# Patient Record
Sex: Male | Born: 1940
Health system: Southern US, Community
[De-identification: ages and names within clinical notes are randomized; demographics above are authoritative.]

## PROBLEM LIST (undated history)

## (undated) DIAGNOSIS — K8020 Calculus of gallbladder without cholecystitis without obstruction: Secondary | ICD-10-CM

## (undated) DIAGNOSIS — C4491 Basal cell carcinoma of skin, unspecified: Secondary | ICD-10-CM

## (undated) DIAGNOSIS — K469 Unspecified abdominal hernia without obstruction or gangrene: Secondary | ICD-10-CM

## (undated) DIAGNOSIS — K5790 Diverticulosis of intestine, part unspecified, without perforation or abscess without bleeding: Secondary | ICD-10-CM

## (undated) DIAGNOSIS — C8590 Non-Hodgkin lymphoma, unspecified, unspecified site: Secondary | ICD-10-CM

## (undated) DIAGNOSIS — N400 Enlarged prostate without lower urinary tract symptoms: Secondary | ICD-10-CM

## (undated) DIAGNOSIS — J309 Allergic rhinitis, unspecified: Secondary | ICD-10-CM

## (undated) DIAGNOSIS — Z8572 Personal history of non-Hodgkin lymphomas: Secondary | ICD-10-CM

## (undated) DIAGNOSIS — Z23 Encounter for immunization: Principal | ICD-10-CM

## (undated) DIAGNOSIS — H409 Unspecified glaucoma: Secondary | ICD-10-CM

## (undated) HISTORY — DX: Unspecified glaucoma: H40.9

## (undated) HISTORY — DX: Diverticulosis of intestine, part unspecified, without perforation or abscess without bleeding: K57.90

## (undated) HISTORY — DX: Encounter for immunization: Z23

## (undated) HISTORY — DX: Non-Hodgkin lymphoma, unspecified, unspecified site: C85.90

## (undated) HISTORY — DX: Personal history of non-Hodgkin lymphomas: Z85.72

## (undated) HISTORY — DX: Unspecified abdominal hernia without obstruction or gangrene: K46.9

## (undated) HISTORY — DX: Benign prostatic hyperplasia without lower urinary tract symptoms: N40.0

## (undated) HISTORY — DX: Calculus of gallbladder without cholecystitis without obstruction: K80.20

## (undated) HISTORY — PX: TONSILLECTOMY: SUR1361

## (undated) HISTORY — PX: APPENDECTOMY: SHX54

---

## 2001-11-14 HISTORY — PX: PROSTATE BIOPSY: SHX241

## 2004-11-01 ENCOUNTER — Ambulatory Visit (HOSPITAL_COMMUNITY): Admission: RE | Admit: 2004-11-01 | Discharge: 2004-11-01 | Payer: Self-pay | Admitting: Gastroenterology

## 2009-11-14 HISTORY — PX: COLONOSCOPY: SHX174

## 2011-12-07 DIAGNOSIS — H409 Unspecified glaucoma: Secondary | ICD-10-CM | POA: Diagnosis not present

## 2011-12-07 DIAGNOSIS — H401290 Low-tension glaucoma, unspecified eye, stage unspecified: Secondary | ICD-10-CM | POA: Diagnosis not present

## 2012-02-16 DIAGNOSIS — J069 Acute upper respiratory infection, unspecified: Secondary | ICD-10-CM | POA: Diagnosis not present

## 2012-03-13 DIAGNOSIS — Z125 Encounter for screening for malignant neoplasm of prostate: Secondary | ICD-10-CM | POA: Diagnosis not present

## 2012-03-13 DIAGNOSIS — R634 Abnormal weight loss: Secondary | ICD-10-CM | POA: Diagnosis not present

## 2012-03-14 ENCOUNTER — Other Ambulatory Visit: Payer: Self-pay | Admitting: Family Medicine

## 2012-03-14 DIAGNOSIS — R634 Abnormal weight loss: Secondary | ICD-10-CM

## 2012-03-15 ENCOUNTER — Ambulatory Visit
Admission: RE | Admit: 2012-03-15 | Discharge: 2012-03-15 | Disposition: A | Discharge status: Home / Self Care | Payer: Medicare Other | Source: Ambulatory Visit | Attending: Family Medicine | Admitting: Family Medicine

## 2012-03-15 DIAGNOSIS — R634 Abnormal weight loss: Secondary | ICD-10-CM

## 2012-04-16 DIAGNOSIS — R634 Abnormal weight loss: Secondary | ICD-10-CM | POA: Diagnosis not present

## 2012-05-08 DIAGNOSIS — L578 Other skin changes due to chronic exposure to nonionizing radiation: Secondary | ICD-10-CM | POA: Diagnosis not present

## 2012-05-08 DIAGNOSIS — D239 Other benign neoplasm of skin, unspecified: Secondary | ICD-10-CM | POA: Diagnosis not present

## 2012-05-08 DIAGNOSIS — R634 Abnormal weight loss: Secondary | ICD-10-CM | POA: Diagnosis not present

## 2012-05-08 DIAGNOSIS — L821 Other seborrheic keratosis: Secondary | ICD-10-CM | POA: Diagnosis not present

## 2012-05-08 DIAGNOSIS — L819 Disorder of pigmentation, unspecified: Secondary | ICD-10-CM | POA: Diagnosis not present

## 2012-06-28 DIAGNOSIS — H4011X0 Primary open-angle glaucoma, stage unspecified: Secondary | ICD-10-CM | POA: Diagnosis not present

## 2012-06-28 DIAGNOSIS — H409 Unspecified glaucoma: Secondary | ICD-10-CM | POA: Diagnosis not present

## 2012-08-14 DIAGNOSIS — K469 Unspecified abdominal hernia without obstruction or gangrene: Secondary | ICD-10-CM

## 2012-08-14 HISTORY — DX: Unspecified abdominal hernia without obstruction or gangrene: K46.9

## 2012-08-30 ENCOUNTER — Other Ambulatory Visit: Payer: Self-pay | Admitting: Family Medicine

## 2012-08-30 DIAGNOSIS — R1904 Left lower quadrant abdominal swelling, mass and lump: Secondary | ICD-10-CM | POA: Diagnosis not present

## 2012-09-04 ENCOUNTER — Ambulatory Visit
Admission: RE | Admit: 2012-09-04 | Discharge: 2012-09-04 | Disposition: A | Discharge status: Home / Self Care | Payer: Medicare Other | Source: Ambulatory Visit | Attending: Family Medicine | Admitting: Family Medicine

## 2012-09-04 DIAGNOSIS — R1904 Left lower quadrant abdominal swelling, mass and lump: Secondary | ICD-10-CM

## 2012-09-04 DIAGNOSIS — K8020 Calculus of gallbladder without cholecystitis without obstruction: Secondary | ICD-10-CM | POA: Diagnosis not present

## 2012-09-13 ENCOUNTER — Encounter (INDEPENDENT_AMBULATORY_CARE_PROVIDER_SITE_OTHER): Payer: Self-pay | Admitting: General Surgery

## 2012-09-13 ENCOUNTER — Ambulatory Visit (INDEPENDENT_AMBULATORY_CARE_PROVIDER_SITE_OTHER): Payer: Medicare Other | Admitting: General Surgery

## 2012-09-13 VITALS — BP 130/68 | HR 68 | Temp 97.4°F | Resp 20 | Ht 74.0 in | Wt 183.4 lb

## 2012-09-13 DIAGNOSIS — K4090 Unilateral inguinal hernia, without obstruction or gangrene, not specified as recurrent: Secondary | ICD-10-CM | POA: Diagnosis not present

## 2012-09-13 NOTE — Progress Notes (Signed)
Patient ID: Daniel Vasquez, male   DOB: 06/24/1941, 71 y.o.   MRN: [id number]  Chief Complaint  Patient presents with  . Pre-op Exam    eval RIH    HPI Daniel Vasquez is a 71 y.o. male.   HPI 71 year old Caucasian male referred by Daniel Vasquez for evaluation of a right inguinal hernia. The patient states that he actually initially noticed something in the left groin. He states that he noticed some puffiness in his left groin a few weeks ago. He denies any pain or discomfort in his groin. He states that he noticed that the left side was just out a little further than the right side. He denies any numbness or tingling in his groin. He denies any fever, chills, nausea, vomiting, diarrhea or constipation. He went to see his primary care physician who ordered a CT scan. There is no evidence of a left inguinal hernia; however, the CT scan showed a right inguinal hernia.  He states that he is very sensitive about hernias because his father passed away from complications after an emergent inguinal hernia surgery. He states that he did lose about 15 pounds a few months ago for unknown reasons however he has gained about 10 of those pounds back.  History reviewed. No pertinent past medical history.  Past Surgical History  Procedure Date  . Appendectomy   . Tonsillectomy     Family History  Problem Relation Age of Onset  . Cancer Father     prostate  . Heart disease Maternal Grandmother     Social History History  Substance Use Topics  . Smoking status: Former Games developer  . Smokeless tobacco: Not on file  . Alcohol Use: Yes     5 drinks per week    Allergies  Allergen Reactions  . Iodinated Diagnostic Agents     Rash and hives with contrast > 19yrs ago    Current Outpatient Prescriptions  Medication Sig Dispense Refill  . aspirin 81 MG tablet Take 81 mg by mouth daily.      [name] OVER THE COUNTER MEDICATION Multi vitamin      . travoprost, benzalkonium, (TRAVATAN) 0.004 % ophthalmic  solution 1 drop at bedtime.        Review of Systems Review of Systems  Constitutional: Negative for fever, chills, appetite change and unexpected weight change.  HENT: Negative for congestion and trouble swallowing.   Eyes: Negative for visual disturbance.  Respiratory: Negative for chest tightness and shortness of breath.   Cardiovascular: Negative for chest pain and leg swelling.       No PND, no orthopnea, no DOE  Gastrointestinal:       See HPI  Genitourinary: Negative for dysuria and hematuria.  Musculoskeletal: Negative.   Skin: Negative for rash.  Neurological: Negative for dizziness, seizures, speech difficulty, light-headedness and headaches.       No amaurosis fugax, no TIA  Hematological: Does not bruise/bleed easily.  Psychiatric/Behavioral: Negative for behavioral problems and confusion.    Blood pressure 130/68, pulse 68, temperature 97.4 F (36.3 C), temperature source Temporal, resp. rate 20, height 6\' 2"  (1.88 m), weight 183 lb 6.4 oz (83.19 kg).  Physical Exam Physical Exam  Vitals reviewed. Constitutional: He is oriented to person, place, and time. He appears well-developed and well-nourished. No distress.  HENT:  Head: Normocephalic and atraumatic.  Right Ear: External ear normal.  Left Ear: External ear normal.  Eyes: Conjunctivae normal are normal. No scleral icterus.  Neck: Normal range  of motion. Neck supple. No tracheal deviation present. No thyromegaly present.  Cardiovascular: Normal rate, regular rhythm and normal heart sounds.   No murmur heard. Pulmonary/Chest: Effort normal and breath sounds normal. No respiratory distress. He has no wheezes. He has no rales.  Abdominal: Soft. Bowel sounds are normal. He exhibits no distension. There is no tenderness. There is no rebound. A hernia is present. Hernia confirmed positive in the right inguinal area. Hernia confirmed negative in the left inguinal area.  Genitourinary: Testes normal and penis normal.  Right testis shows no mass. Left testis shows no mass.       Pt examined supine and standing: Reducible right direct inguinal hernia; no obvious hernia on left; very vague fullness in left groin  Musculoskeletal: Normal range of motion. He exhibits no edema and no tenderness.  Lymphadenopathy:    He has no cervical adenopathy.  Neurological: He is alert and oriented to person, place, and time. He exhibits normal muscle tone.  Skin: Skin is warm and dry. No rash noted. He is not diaphoretic. No erythema. No pallor.  Psychiatric: He has a normal mood and affect. His behavior is normal. Judgment and thought content normal.    Data Reviewed Dr Nnodi's note   CT ABDOMEN AND PELVIS WITHOUT CONTRAST  Technique: Multidetector CT imaging of the abdomen and pelvis was  performed following the standard protocol without intravenous  contrast.  Comparison: Upper GI of 03/15/2012.  Findings: Inferior right middle lobe lung nodule which measures 4  mm on image 7.  Left lower lobe lung nodule which measures 4 mm on image 4.  Heart size upper normal, without pericardial or pleural effusion.  The lower thoracic esophagus is mildly dilated, and fluid-filled on  image 4.  Normal uninfused appearance of the liver. A small splenule.  Normal pancreas. A 1.7 cm stone in the gallbladder neck, without  acute cholecystitis or biliary ductal dilatation.  Normal adrenal glands and kidneys, without hydronephrosis. No  retroperitoneal or retrocrural adenopathy.  Normal colon and terminal ileum. Appendix surgically absent.  Normal small bowel without abdominal ascites.  There is no evidence of left inguinal hernia or groin mass. No  pelvic adenopathy. Urinary bladder is positioned at the entrance  of the right inguinal canal on image 77. Suspect minimal fat  containing hernia on image 79. Normal prostate, without  significant free pelvic fluid. Mild degenerative changes in the  bilateral hips and sacroiliac  joints. No acute osseous abnormality.  IMPRESSION:  1. No evidence of left-sided mass or hernia.  2. Cholelithiasis.  3. Fluid in the lower thoracic esophagus, suggesting dysmotility  or gastroesophageal reflux.  4. Right-sided urinary bladder entering a small right inguinal  hernia.  4. Lung base nodules up to 4 mm. If the patient is at high risk  for bronchogenic carcinoma, follow-up chest CT at 1 year is  recommended. If the patient is at low risk, no follow-up is  needed.    Assessment    Right inguinal hernia Questionable LI[facility]    Plan    We discussed the etiology of inguinal hernias. We discussed the signs & symptoms of incarceration & strangulation.  We discussed non-operative and operative management. I discussed laparoscopic repair. Because of his sensation of fullness on the left side, I recommended a laparoscopic approach. That way we can evaluate the left side and if he does have a hernia on left it can be repaired at the same time as the right inguinal hernia  The patient  has elected to proceed with a laparoscopic repair of a right inguinal hernia with mesh possible left hernia as well    I described the procedure in detail.  The patient was given educational material. We discussed the risks and benefits including but not limited to bleeding, infection, chronic inguinal pain, nerve entrapment, hernia recurrence, mesh complications, hematoma formation, urinary retention, injury to the testicles, numbness in the groin, blood clots, injury to the surrounding structures, and anesthesia risk. We also discussed the typical post operative recovery course, including no heavy lifting for 4-6 weeks. I explained that the likelihood of improvement of their symptoms is good    He would like to think about it for a day or 2. I told him to contact the office if he has additional questions or when he would like to schedule surgery  I will defer the followup of lung nodules to his primary  care physician  Mary Sella. Andrey Campanile, MD, FACS General, Bariatric, & Minimally Invasive Surgery [facility], [place]         [facility] M 09/13/2012, 5:05 PM

## 2012-09-13 NOTE — Patient Instructions (Signed)
Please call our office when you would like to schedule surgery or if you have additional questions

## 2012-09-17 DIAGNOSIS — R109 Unspecified abdominal pain: Secondary | ICD-10-CM | POA: Diagnosis not present

## 2012-09-17 DIAGNOSIS — R319 Hematuria, unspecified: Secondary | ICD-10-CM | POA: Diagnosis not present

## 2012-09-18 ENCOUNTER — Other Ambulatory Visit: Payer: Self-pay | Admitting: Family Medicine

## 2012-09-18 DIAGNOSIS — R319 Hematuria, unspecified: Secondary | ICD-10-CM

## 2012-09-19 ENCOUNTER — Ambulatory Visit
Admission: RE | Admit: 2012-09-19 | Discharge: 2012-09-19 | Disposition: A | Discharge status: Home / Self Care | Payer: Medicare Other | Source: Ambulatory Visit | Attending: Family Medicine | Admitting: Family Medicine

## 2012-09-19 DIAGNOSIS — R319 Hematuria, unspecified: Secondary | ICD-10-CM | POA: Diagnosis not present

## 2012-09-19 DIAGNOSIS — R109 Unspecified abdominal pain: Secondary | ICD-10-CM | POA: Diagnosis not present

## 2012-10-01 DIAGNOSIS — Z23 Encounter for immunization: Secondary | ICD-10-CM | POA: Diagnosis not present

## 2012-10-15 DIAGNOSIS — R319 Hematuria, unspecified: Secondary | ICD-10-CM | POA: Diagnosis not present

## 2012-11-22 DIAGNOSIS — R599 Enlarged lymph nodes, unspecified: Secondary | ICD-10-CM | POA: Diagnosis not present

## 2012-12-04 DIAGNOSIS — R599 Enlarged lymph nodes, unspecified: Secondary | ICD-10-CM | POA: Diagnosis not present

## 2012-12-13 DIAGNOSIS — R221 Localized swelling, mass and lump, neck: Secondary | ICD-10-CM | POA: Diagnosis not present

## 2012-12-13 DIAGNOSIS — R220 Localized swelling, mass and lump, head: Secondary | ICD-10-CM | POA: Diagnosis not present

## 2012-12-14 ENCOUNTER — Other Ambulatory Visit: Payer: Self-pay | Admitting: Otolaryngology

## 2012-12-14 DIAGNOSIS — R220 Localized swelling, mass and lump, head: Secondary | ICD-10-CM

## 2012-12-20 ENCOUNTER — Ambulatory Visit
Admission: RE | Admit: 2012-12-20 | Discharge: 2012-12-20 | Disposition: A | Discharge status: Home / Self Care | Payer: Medicare Other | Source: Ambulatory Visit | Attending: Otolaryngology | Admitting: Otolaryngology

## 2012-12-20 DIAGNOSIS — R220 Localized swelling, mass and lump, head: Secondary | ICD-10-CM | POA: Diagnosis not present

## 2012-12-20 DIAGNOSIS — R221 Localized swelling, mass and lump, neck: Secondary | ICD-10-CM | POA: Diagnosis not present

## 2012-12-20 MED ORDER — GADOBENATE DIMEGLUMINE 529 MG/ML IV SOLN
17.0000 mL | Freq: Once | INTRAVENOUS | Status: AC | PRN
  Administered 2012-12-20: 17 mL via INTRAVENOUS

## 2012-12-26 ENCOUNTER — Other Ambulatory Visit: Payer: Self-pay | Admitting: Otolaryngology

## 2012-12-26 DIAGNOSIS — R599 Enlarged lymph nodes, unspecified: Secondary | ICD-10-CM | POA: Diagnosis not present

## 2012-12-26 DIAGNOSIS — D234 Other benign neoplasm of skin of scalp and neck: Secondary | ICD-10-CM | POA: Diagnosis not present

## 2012-12-26 DIAGNOSIS — C801 Malignant (primary) neoplasm, unspecified: Secondary | ICD-10-CM | POA: Insufficient documentation

## 2012-12-26 DIAGNOSIS — D487 Neoplasm of uncertain behavior of other specified sites: Secondary | ICD-10-CM | POA: Diagnosis not present

## 2012-12-26 DIAGNOSIS — R221 Localized swelling, mass and lump, neck: Secondary | ICD-10-CM | POA: Diagnosis not present

## 2012-12-26 DIAGNOSIS — R220 Localized swelling, mass and lump, head: Secondary | ICD-10-CM | POA: Diagnosis not present

## 2012-12-26 DIAGNOSIS — L723 Sebaceous cyst: Secondary | ICD-10-CM | POA: Diagnosis not present

## 2012-12-26 HISTORY — PX: SOFT TISSUE MASS EXCISION: SHX2419

## 2012-12-28 DIAGNOSIS — C8590 Non-Hodgkin lymphoma, unspecified, unspecified site: Secondary | ICD-10-CM

## 2012-12-28 HISTORY — DX: Non-Hodgkin lymphoma, unspecified, unspecified site: C85.90

## 2012-12-29 ENCOUNTER — Other Ambulatory Visit: Payer: Self-pay

## 2013-01-01 ENCOUNTER — Telehealth: Payer: Self-pay | Admitting: Oncology

## 2013-01-01 NOTE — Telephone Encounter (Signed)
LVOM for pt return call.  °

## 2013-01-02 ENCOUNTER — Telehealth: Payer: Self-pay | Admitting: Oncology

## 2013-01-02 NOTE — Telephone Encounter (Signed)
C/D 01/02/13 for appt. 01/08/13

## 2013-01-02 NOTE — Telephone Encounter (Signed)
S/W pt in re NP appt 02/25 @ 1:30 w/Dr. Gaylyn Rong.  Referring Dr. Melvenia Beam Dx- Non-Hodgkins B-Cell Lymphoma Welcome packet mailed.

## 2013-01-03 ENCOUNTER — Encounter: Payer: Self-pay | Admitting: *Deleted

## 2013-01-03 ENCOUNTER — Other Ambulatory Visit: Payer: Self-pay | Admitting: Oncology

## 2013-01-03 DIAGNOSIS — Z8579 Personal history of other malignant neoplasms of lymphoid, hematopoietic and related tissues: Secondary | ICD-10-CM | POA: Insufficient documentation

## 2013-01-03 DIAGNOSIS — K8020 Calculus of gallbladder without cholecystitis without obstruction: Secondary | ICD-10-CM | POA: Insufficient documentation

## 2013-01-03 DIAGNOSIS — K469 Unspecified abdominal hernia without obstruction or gangrene: Secondary | ICD-10-CM | POA: Insufficient documentation

## 2013-01-03 DIAGNOSIS — C8590 Non-Hodgkin lymphoma, unspecified, unspecified site: Secondary | ICD-10-CM

## 2013-01-04 ENCOUNTER — Other Ambulatory Visit: Payer: Self-pay | Admitting: Lab

## 2013-01-04 ENCOUNTER — Ambulatory Visit
Admission: RE | Admit: 2013-01-04 | Discharge: 2013-01-04 | Disposition: A | Discharge status: Home / Self Care | Payer: Medicare Other | Source: Ambulatory Visit | Attending: Radiation Oncology | Admitting: Radiation Oncology

## 2013-01-04 ENCOUNTER — Encounter: Payer: Self-pay | Admitting: Radiation Oncology

## 2013-01-04 VITALS — BP 139/84 | HR 66 | Temp 98.1°F | Resp 20 | Ht 72.0 in | Wt 185.4 lb

## 2013-01-04 DIAGNOSIS — Z79899 Other long term (current) drug therapy: Secondary | ICD-10-CM | POA: Diagnosis not present

## 2013-01-04 DIAGNOSIS — C8589 Other specified types of non-Hodgkin lymphoma, extranodal and solid organ sites: Secondary | ICD-10-CM | POA: Diagnosis not present

## 2013-01-04 DIAGNOSIS — Z7982 Long term (current) use of aspirin: Secondary | ICD-10-CM | POA: Diagnosis not present

## 2013-01-04 DIAGNOSIS — C4491 Basal cell carcinoma of skin, unspecified: Secondary | ICD-10-CM | POA: Insufficient documentation

## 2013-01-04 DIAGNOSIS — N400 Enlarged prostate without lower urinary tract symptoms: Secondary | ICD-10-CM | POA: Insufficient documentation

## 2013-01-04 DIAGNOSIS — C8590 Non-Hodgkin lymphoma, unspecified, unspecified site: Secondary | ICD-10-CM

## 2013-01-04 HISTORY — DX: Basal cell carcinoma of skin, unspecified: C44.91

## 2013-01-04 HISTORY — DX: Allergic rhinitis, unspecified: J30.9

## 2013-01-04 LAB — COMPREHENSIVE METABOLIC PANEL (CC13)
ALT: 30 U/L (ref 0–55)
AST: 25 U/L (ref 5–34)
Alkaline Phosphatase: 95 U/L (ref 40–150)
Sodium: 138 mEq/L (ref 136–145)
Total Bilirubin: 0.57 mg/dL (ref 0.20–1.20)
Total Protein: 7.2 g/dL (ref 6.4–8.3)

## 2013-01-04 LAB — CBC WITH DIFFERENTIAL/PLATELET
EOS%: 0.8 % (ref 0.0–7.0)
MCH: 30.8 pg (ref 27.2–33.4)
MCV: 89.6 fL (ref 79.3–98.0)
MONO%: 11.7 % (ref 0.0–14.0)
NEUT#: 3.2 10*3/uL (ref 1.5–6.5)
RBC: 4.97 10*6/uL (ref 4.20–5.82)
RDW: 13.5 % (ref 11.0–14.6)

## 2013-01-04 LAB — LACTATE DEHYDROGENASE (CC13): LDH: 150 U/L (ref 125–245)

## 2013-01-04 NOTE — Progress Notes (Signed)
Pt married, wife w/pt today. 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 Pt scored 9/10 on distress scale; states it is due to new diagnosis 2 days ago, unsure if  this is his diagnosis,  lack of information. Pt and wife state they have many questions. Pt does not want to see SW today, but understands this RN will call SW to inform them of his stress level today.

## 2013-01-04 NOTE — Progress Notes (Signed)
Radiation Oncology         [phone]) [phone] ________________________________  Initial outpatient Consultation - Date: 01/04/2013   Name: Daniel Vasquez MRN: [id number]   DOB: [birth year]  REFERRING PHYSICIAN: Melvenia Beam, MD  DIAGNOSIS: The encounter diagnosis was Non-Hodgkin lymphoma.  HISTORY OF PRESENT ILLNESS::Daniel Vasquez is a 72 y.o. male  who had a six-month history of weight loss. This was noted by family members as well as his primary care physician. He estimates this to be about 10-20 pounds. He was told to increase his caloric intake and gained about 3 pounds back. He had not noticed any fevers chills or night sweats. The only interesting symptom is his back was itching more than normal. He does have a history of allergies to many products however so he attributed this to his allergies. He presented in October with complaints of abdominal swelling. A CT scan at that time was performed which showed a small right inguinal hernia. Reflux disease was also possibly noted. 4 mm lung nodules were noted. In January he noticed a pea-sized lesion behind his right ear. He attributed this to a bug bite. When it did not improve he sought attention. He was referred to ENT. This showed on the original physical exam ill-defined area of tenderness some very mild swelling. An MRI of this area was ordered and completed. This showed a 6 x 8 intense lesion within the subcutaneous fat with mild enhancement compatible with a small lymph node. A left mastoid effusion was noted. No other lymphadenopathy was noted. No mucosal lesions were noted. Secondary to this finding I observation versus FNA versus excisional biopsy were discussed. He chose to undergo excisional biopsy which was performed on 12/26/2012. This showed a lymph node with atypical follicular proliferation suspicious for non-Hodgkin's B-cell lymphoma. In discussing this with pathology it basically is a single lymph node with about 5 follicles of  high-grade appearing cells which are BCL-2 positive. This is highly concerning for a large cell follicular lymphoma. More tissue from another site may provide more information. He continues to not have any B. symptoms other than the weight loss which has stabilized. He has not had a PET scan. He is scheduled to meet Dr. Gaylyn Rong next week.  PREVIOUS RADIATION THERAPY: No  PAST MEDICAL HISTORY:  has a past medical history of Hernia (08/2012); Gallstones; BPH (benign prostatic hyperplasia); Glaucoma; Non-Hodgkin lymphoma (12/28/12); Allergic rhinitis; and Skin cancer, basal cell.    PAST SURGICAL HISTORY: Past Surgical History  Procedure Laterality Date  . Appendectomy    . Tonsillectomy    . Soft tissue mass excision  12/26/12    right neck lymph nodal tissue   . Prostate biopsy  2003    negative    FAMILY HISTORY: @[facility] @  SOCIAL HISTORY:  History  Substance Use Topics  . Smoking status: Former Games developer  . Smokeless tobacco: Not on file     Comment: in 20's, social smoker  . Alcohol Use: Yes     Comment: 5 drinks per week    ALLERGIES: Iodinated diagnostic agents  MEDICATIONS:  Current Outpatient Prescriptions  Medication Sig Dispense Refill  . aspirin 81 MG tablet Take 81 mg by mouth daily.      [name] OVER THE COUNTER MEDICATION Multi vitamin      . psyllium (METAMUCIL) 58.6 % packet Take 1 packet by mouth daily.      . travoprost, benzalkonium, (TRAVATAN) 0.004 % ophthalmic solution 1 drop at bedtime.  No current facility-administered medications for this encounter.    REVIEW OF SYSTEMS:  A 15 point review of systems is documented in the electronic medical record. This was obtained by the nursing staff. However, I reviewed this with the patient to discuss relevant findings and make appropriate changes.  Pertinent items are noted in HPI.   PHYSICAL EXAM:  Filed Vitals:   01/04/13 0911  BP: 139/84  Pulse: 66  Temp: 98.1 F (36.7 C)  Resp: 20  . He is a pleasant male in no  distress sitting comfortably examining chair. He appears younger than his stated age. He is alert and oriented x3. He has no palpable submandibular cervical or supraclavicular adenopathy. He has no palpable axillary adenopathy bilaterally. He has no palpable epitrochlear or popliteal adenopathy. I'm not able to palpate his right hernia. There is some fullness in his left inguinal region. I'm not able to palpate discrete nodes however. He has no hepatosplenomegaly.  LABORATORY DATA:  No results found for this basename: WBC, HGB, HCT, MCV, PLT   No results found for this basename: NA, K, CL, CO2   No results found for this basename: ALT, AST, GGT, ALKPHOS, BILITOT     RADIOGRAPHY: Mr Neck Soft Tissue Only W Wo Contrast  12/20/2012  *RADIOLOGY REPORT*  Clinical Data: Right postauricular mass.  Swelling, mass, or lump in head and neck.  BUN and creatinine were obtained on site at [facility] at [address] Results:  BUN 12 mg/dL,  Creatinine 0.9 mg/dL.  MR NECK SOFT TISSUE ONLY WITHOUT AND WITH CONTRAST  Contrast: 17mL MULTIHANCE GADOBENATE DIMEGLUMINE 529 MG/ML IV SOLN  Comparison: None.  Findings: A 6 x 8 mm T2 hyperintense lesion is present within the subcutaneous fat subjacent to the area marked.  This demonstrates mild enhancement is most compatible with a small lymph node.  No additional mass lesions are present.  A left mastoid effusion is present.  There is enhancement within the left mastoid air cells.  The visualized paranasal sinuses are otherwise clear.  No obstructing nasopharyngeal lesion is evident.  No significant anterior cervical lymph nodes are present.  The salivary glands are within normal limits.  No significant mucosal or submucosal lesion is present.  IMPRESSION:  1.  8 mm right subcutaneous lesion is most compatible with a small benign appearing lymph node. 2.  Left mastoid effusion demonstrates mild enhancement.  This may be infected.   Original Report Authenticated  By: Marin Roberts, M.D.       IMPRESSION: 72 year old male status post excisional biopsy of a posterior auricular lymph node with findings suspicious for non-Hodgkin's lymphoma  PLAN: I spoke with the patient and his wife regarding his diagnosis. I gave him a copy of his pathology report and we discussed the uncertainty. A PET scan would certainly help if it is positive and shows other areas of disease. This will be helpful not only to a more tissue but also to provide a more clear diagnosis. If his PET scan is negative pathology is concerned that this is a more high-grade lesion and therefore he would be at risk for elsewhere failures within the body. For this reason chemotherapy may be considered. If there is no other disease however and we have found this in the very early stage it also possible we can just proceed on with localized radiation. I think it's really hard to know how more formation. I've asked him to have his labs drawn today and is scheduled him for  a PET scan on Tuesday right before his appointment Dr. Gaylyn Rong. I gave him a copy of his CT scan report. I have not scheduled followup with me. I will followup with Dr. Gaylyn Rong on Tuesday.  I spent 60 minutes  face to face with the patient and more than 50% of that time was spent in counseling and/or coordination of care.   ------------------------------------------------  Lurline Hare, MD

## 2013-01-04 NOTE — Progress Notes (Signed)
Left vm for L Mullis, SW informing her of pt's distress scale score and his concerns, but that pt does not wish to see SW today or to receive a call at this time. Pt was made aware of [facility] support team members, and this RN informed him they are available for him at any point. Pt verbalized understanding.

## 2013-01-04 NOTE — Progress Notes (Signed)
Please see the Nurse Progress Note in the MD Initial Consult Encounter for this patient. 

## 2013-01-08 ENCOUNTER — Other Ambulatory Visit: Payer: Medicare Other | Admitting: Lab

## 2013-01-08 ENCOUNTER — Encounter (HOSPITAL_COMMUNITY)
Admission: RE | Admit: 2013-01-08 | Discharge: 2013-01-08 | Disposition: A | Discharge status: Home / Self Care | Payer: Medicare Other | Source: Ambulatory Visit | Attending: Radiation Oncology | Admitting: Radiation Oncology

## 2013-01-08 ENCOUNTER — Ambulatory Visit: Payer: Medicare Other | Admitting: Oncology

## 2013-01-08 ENCOUNTER — Ambulatory Visit: Payer: Medicare Other

## 2013-01-08 DIAGNOSIS — C8589 Other specified types of non-Hodgkin lymphoma, extranodal and solid organ sites: Secondary | ICD-10-CM | POA: Insufficient documentation

## 2013-01-08 DIAGNOSIS — C8590 Non-Hodgkin lymphoma, unspecified, unspecified site: Secondary | ICD-10-CM

## 2013-01-08 DIAGNOSIS — N433 Hydrocele, unspecified: Secondary | ICD-10-CM | POA: Diagnosis not present

## 2013-01-08 DIAGNOSIS — K8020 Calculus of gallbladder without cholecystitis without obstruction: Secondary | ICD-10-CM

## 2013-01-08 HISTORY — DX: Calculus of gallbladder without cholecystitis without obstruction: K80.20

## 2013-01-08 MED ORDER — FLUDEOXYGLUCOSE F - 18 (FDG) INJECTION
18.6000 | Freq: Once | INTRAVENOUS | Status: AC | PRN
  Administered 2013-01-08: 18.6 via INTRAVENOUS

## 2013-01-09 ENCOUNTER — Ambulatory Visit: Payer: Medicare Other

## 2013-01-09 ENCOUNTER — Encounter: Payer: Self-pay | Admitting: Oncology

## 2013-01-09 ENCOUNTER — Other Ambulatory Visit: Payer: Medicare Other

## 2013-01-09 ENCOUNTER — Ambulatory Visit (HOSPITAL_BASED_OUTPATIENT_CLINIC_OR_DEPARTMENT_OTHER): Payer: Medicare Other | Admitting: Oncology

## 2013-01-09 DIAGNOSIS — Z8042 Family history of malignant neoplasm of prostate: Secondary | ICD-10-CM | POA: Diagnosis not present

## 2013-01-09 DIAGNOSIS — Z85828 Personal history of other malignant neoplasm of skin: Secondary | ICD-10-CM | POA: Diagnosis not present

## 2013-01-09 DIAGNOSIS — C8590 Non-Hodgkin lymphoma, unspecified, unspecified site: Secondary | ICD-10-CM

## 2013-01-09 DIAGNOSIS — C8291 Follicular lymphoma, unspecified, lymph nodes of head, face, and neck: Secondary | ICD-10-CM | POA: Diagnosis not present

## 2013-01-09 NOTE — Patient Instructions (Addendum)
1.  Diagnosis:  Very early presentation of follicular Non Hogkin's lymphoma. 2.  Staging:  Stage 0. 3.  Recommendation:  *  Observation versus.   *  Radiation to the right neck where the node was removed.   *  There is no strong indication for chemotherapy at this time.  There is no residual lymphoma.  If the biopsy had shown definitively high grade lymphoma, chemo therapy such as R-CHOP could have been entertained.  However, chemo has side effects that are at time permanent (10-15% chance of congestive heart failure and about 1% risk of leukemia). 4.  Follow up:  Repeat CT scan in about 6 months and follow up the next day but sooner if concerning symptoms.

## 2013-01-09 NOTE — Progress Notes (Signed)
[facility]  [phone] [phone]     INITIAL HEMATOLOGY CONSULTATION    Referral MD:  Dr. Melvenia Beam, M.D.   Reason for Referral: newly diagnosed, right neck lymphoid nodal tissue with atypical follicular proliferation suspicious for partial/early involvement of follicular NHL.     HPI: Daniel Vasquez is a 72 year-old man without significant PMH.  He developed about 20-lb non intentional weight loss last year with negative work up.  Earlier this year, he noticed a small nodule behind his right ear.  He thought at first that it was a bug bite since it was red and slightly tender.  He was given an empiric course of antibiotic without resolution.  He was then referred to Dr. Emeline Darling from ENT who performed an MRI of the neck on 12/20/2012 which showed an 8mm lesion in the subcutaneous fat behind the right ear.  He then underwent excisional biopsy of the mass on 12/26/1022 with path case # [id number].  It showed a few atypical lymphoid follicles characterized by ill defined mantle zones and hemogeneous composition of medium to large lymphoid cells.  Flow cytometry was not contributory. ICH showed positive for CD20 and BCL-2.  The final path was suspicious but not definitive for follicular NHL. He was kindly referred to the [facility] for evaluation.  Mr. [name] today presented to the Cancer Center for the first time with his wife.  He has slight discomfort in the biopsy site.  He denied purulent discharge or bleeding from the area. He denied palpable adenopathy anywhere else.  He no longer loses weight; he has regained most of the lost weight back.    Patient denies fever, anorexia, weight loss, fatigue, headache, visual changes, confusion, drenching night sweats, palpable lymph node swelling, mucositis, odynophagia, dysphagia, nausea vomiting, jaundice, chest pain, palpitation, shortness of breath, dyspnea on exertion, productive cough, gum bleeding, epistaxis,  hematemesis, hemoptysis, abdominal pain, abdominal swelling, early satiety, melena, hematochezia, hematuria, skin rash, spontaneous bleeding, joint swelling, joint pain, heat or cold intolerance, bowel bladder incontinence, back pain, focal motor weakness, paresthesia, depression.      Past Medical History  Diagnosis Date  . Hernia 08/2012    right inguinal seen on ct scan  . Gallstones   . BPH (benign prostatic hyperplasia)   . Glaucoma   . Non-Hodgkin lymphoma 12/28/12    B cell lymphoma  . Allergic rhinitis   . Skin cancer, basal cell     basal cell, squamous cell  . Diverticulosis   :    Past Surgical History  Procedure Laterality Date  . Appendectomy    . Tonsillectomy    . Soft tissue mass excision  12/26/12    right neck lymph nodal tissue   . Prostate biopsy  2003    negative  . Colonoscopy  2011    neg except for diverticulosis  :   CURRENT MEDS: Current Outpatient Prescriptions  Medication Sig Dispense Refill  . aspirin 81 MG tablet Take 81 mg by mouth daily.      [name] OVER THE COUNTER MEDICATION Multi vitamin      . psyllium (METAMUCIL) 58.6 % packet Take 1 packet by mouth daily.      . travoprost, benzalkonium, (TRAVATAN) 0.004 % ophthalmic solution 1 drop at bedtime.       No current facility-administered medications for this visit.      Allergies  Allergen Reactions  . Iodinated Diagnostic Agents     Rash and hives with  contrast > 29yrs ago  :  Family History  Problem Relation Age of Onset  . Cancer Father     prostate  . Heart disease Maternal Grandmother   . Stroke Mother   :  History   Social History  . Marital Status: Married    Spouse Name: N/A    Number of Children: 3  . Years of Education: N/A   Occupational History  .      retired Art gallery manager, Retail buyer   Social History Main Topics  . Smoking status: Former Smoker -- 0.25 packs/day for 2 years    Quit date: 11/14/1968  . Smokeless tobacco: Never Used     Comment: in  20's, social smoker  . Alcohol Use: No     Comment: 5 drinks per week  . Drug Use: No  . Sexually Active: Not on file   Other Topics Concern  . Not on file   Social History Narrative   Married, Research scientist (medical)  :  REVIEW OF SYSTEM:  The rest of the 14-point review of sytem was negative.   Exam: ECOG 0.   General:  well-nourished man in no acute distress.  Eyes:  no scleral icterus.  ENT:  There were no oropharyngeal lesions.  Neck was without thyromegaly.  Lymphatics:  Negative cervical, supraclavicular or axillary adenopathy. The right posterior auricular area has granulation tissue without erythema or purulent discharge.  Respiratory: lungs were clear bilaterally without wheezing or crackles.  Cardiovascular:  Regular rate and rhythm, S1/S2, without murmur, rub or gallop.  There was no pedal edema.  GI:  abdomen was soft, flat, nontender, nondistended, without organomegaly.  Muscoloskeletal:  no spinal tenderness of palpation of vertebral spine.  Skin exam was without echymosis, petichae.  Neuro exam was nonfocal.  Patient was able to get on and off exam table without assistance.  Gait was normal.  Patient was alerted and oriented.  Attention was good.   Language was appropriate.  Mood was normal without depression.  Speech was not pressured.  Thought content was not tangential.    LABS:  Lab Results  Component Value Date   WBC 5.0 01/04/2013   HGB 15.3 01/04/2013   HCT 44.6 01/04/2013   PLT 235 01/04/2013   GLUCOSE 106* 01/04/2013   ALT 30 01/04/2013   AST 25 01/04/2013   NA 138 01/04/2013   K 4.3 01/04/2013   CL 103 01/04/2013   CREATININE 0.8 01/04/2013   BUN 14.2 01/04/2013   CO2 27 01/04/2013    Mr Neck Soft Tissue Only W Wo Contrast  12/20/2012  *RADIOLOGY REPORT*  Clinical Data: Right postauricular mass.  Swelling, mass, or lump in head and neck.  BUN and creatinine were obtained on site at [facility] at [address] Results:  BUN 12 mg/dL,  Creatinine 0.9 mg/dL.  MR  NECK SOFT TISSUE ONLY WITHOUT AND WITH CONTRAST  Contrast: 17mL MULTIHANCE GADOBENATE DIMEGLUMINE 529 MG/ML IV SOLN  Comparison: None.  Findings: A 6 x 8 mm T2 hyperintense lesion is present within the subcutaneous fat subjacent to the area marked.  This demonstrates mild enhancement is most compatible with a small lymph node.  No additional mass lesions are present.  A left mastoid effusion is present.  There is enhancement within the left mastoid air cells.  The visualized paranasal sinuses are otherwise clear.  No obstructing nasopharyngeal lesion is evident.  No significant anterior cervical lymph nodes are present.  The salivary glands are within normal limits.  No significant mucosal or submucosal lesion is present.  IMPRESSION:  1.  8 mm right subcutaneous lesion is most compatible with a small benign appearing lymph node. 2.  Left mastoid effusion demonstrates mild enhancement.  This may be infected.   Original Report Authenticated By: Marin Roberts, M.D.    I personally reviewed the following PET scan and showed the patient and his wife the images.   Nm Pet Image Initial (pi) Skull Base To Thigh  01/09/2013  *RADIOLOGY REPORT*  Clinical Data: Initial treatment strategy for non-Hodgkins lymphoma.Status post biopsy of a right post auricular scalp lesion.  NUCLEAR MEDICINE PET SKULL BASE TO THIGH  Fasting Blood Glucose:  86  Technique:  18.6 mCi F-18 FDG was injected intravenously. CT data was obtained and used for attenuation correction and anatomic localization only.  (This was not acquired as a diagnostic CT examination.) Additional exam technical data entered on technologist worksheet.  Comparison:  MR neck of 12/20/2012.  Abdominal pelvic CT of 09/04/2012.  Findings:  Neck: Minimal hypermetabolism at the site of resected post auricular right-sided node, likely postoperative.  Concurrent mild subcutaneous edema in this area.  Example image 10/series 2.  Chest:  No abnormal hypermetabolism.   Abdomen/Pelvis:  No abnormal hypermetabolism.  Skeleton:  No abnormal osseous metabolism.  CT  images performed for attenuation correction demonstrate no cervical adenopathy.  No thoracic adenopathy.  No abdominal adenopathy.  Cholelithiasis. Extensive colonic diverticulosis.  Subtle edema suspected adjacent the sigmoid, including on image 212/series 2.  Small right hydrocele.  IMPRESSION: No evidence of hypermetabolic lymphoma.  Cholelithiasis.  Possible sigmoid diverticulitis.  Correlate with left lower quadrant symptoms.   Original Report Authenticated By: Jeronimo Greaves, M.D.      ASSESSMENT AND PLAN:   1.  Diagnosis:  Very early presentation of follicular Non Hogkin's lymphoma.  The grade of this process was not able to be assigned on this very limited tissue.  2.  Staging:  Stage 0. 3.  Recommendation:  *  Observation versus.   *  Radiation to the right neck where the node was removed to decrease the risk of local recurrence and potentially also cure if the path were low grade follicular lymphoma.    *  There is no strong indication for chemotherapy at this time.  There is no residual lymphoma.  If the biopsy had shown definitively high grade lymphoma, chemo therapy such as R-CHOP could have been entertained.  However, this chemo has a few side effects that are at time permanent (10-15% chance of congestive heart failure and about 1% risk of leukemia).  Therefore, I do not recommend chemotherapy at this time, given lack of any residual lymphoma on exam or on PET scan, and that the original involvement was rather small.  The potential benefit of chemo RCHOP in the case does not outweigh the potential side effects.  On the other hand, if he were to have indolent (grade 1 or 2) follicular lymphoma, then the recommendation would not be for chemo anyway.    Mr. Phenix and his wife expressed informed understanding and agreed with my recommendation not to get chemo.  He will discuss with Dr. Michell Heinrich the  pros and cons of radiation and proceed as appropriate.   4.  Follow up:  Repeat CT scan in about 6 months and follow up the next day but sooner if concerning symptoms.   The length of time of the face-to-face encounter was 45 minutes. More than 50% of time was spent  counseling and coordination of care.     Thank you for this referral.

## 2013-01-10 ENCOUNTER — Encounter: Payer: Self-pay | Admitting: *Deleted

## 2013-01-14 ENCOUNTER — Telehealth: Payer: Self-pay | Admitting: Oncology

## 2013-01-14 NOTE — Telephone Encounter (Signed)
s.w. wife and she wrote down august appts...d/t ok

## 2013-01-17 ENCOUNTER — Encounter: Payer: Self-pay | Admitting: Radiation Oncology

## 2013-01-18 ENCOUNTER — Encounter: Payer: Self-pay | Admitting: Radiation Oncology

## 2013-01-18 ENCOUNTER — Ambulatory Visit
Admission: RE | Admit: 2013-01-18 | Discharge: 2013-01-18 | Disposition: A | Discharge status: Home / Self Care | Payer: Medicare Other | Source: Ambulatory Visit | Attending: Radiation Oncology | Admitting: Radiation Oncology

## 2013-01-18 VITALS — BP 125/80 | HR 68 | Temp 97.6°F | Resp 20 | Wt 184.6 lb

## 2013-01-18 DIAGNOSIS — D4989 Neoplasm of unspecified behavior of other specified sites: Secondary | ICD-10-CM | POA: Insufficient documentation

## 2013-01-18 DIAGNOSIS — C8590 Non-Hodgkin lymphoma, unspecified, unspecified site: Secondary | ICD-10-CM

## 2013-01-18 NOTE — Progress Notes (Signed)
Wife here w/pt today. Pt denies pain, fatigue, loss of appetite, difficulty eating, swallowing. To review PET scan results today, discuss radiation.

## 2013-01-18 NOTE — Progress Notes (Signed)
Please see the Nurse Progress Note in the MD Initial Consult Encounter for this patient. 

## 2013-01-18 NOTE — Progress Notes (Signed)
   Department of Radiation Oncology  Phone:  [phone] Fax:        [phone]   Name: Daniel Vasquez MRN: [id number]  DOB: Mar 08, 1941  Date: 01/18/2013  Follow Up Visit Note  Diagnosis: Possible high grade folloicular lymphoma  Interval History: Izack presents today for routine followup.  He had a PET scan which revealed just a minimal hypermetabolic activity at the site of his excision. No other evidence for lymphoma was noted. He continues to feel well. He discussed his options with Dr. Gaylyn Rong who has recommended observation versus local radiotherapy. He is scheduled for CT scan in followup in August. He asked to come in and discuss options with me.  Allergies:  Allergies  Allergen Reactions  . Iodinated Diagnostic Agents     Rash and hives with contrast > 63yrs ago    Medications:  Current Outpatient Prescriptions  Medication Sig Dispense Refill  . aspirin 81 MG tablet Take 81 mg by mouth daily.      [name] OVER THE COUNTER MEDICATION Multi vitamin      . psyllium (METAMUCIL) 58.6 % packet Take 1 packet by mouth daily.      . travoprost, benzalkonium, (TRAVATAN) 0.004 % ophthalmic solution 1 drop at bedtime.       No current facility-administered medications for this encounter.    Physical Exam:  Filed Vitals:   01/18/13 0838  BP: 125/80  Pulse: 68  Temp: 97.6 F (36.4 C)  Resp: 20   he has excision behind his right ear which is well-healed. He is alert and oriented x3.  IMPRESSION: Dejion is a 72 y.o. male status post excisional biopsy of a right neck node revealing atypical follicular proliferation suspicious for non-Hodgkin's B-cell lymphoma  PLAN:  I spoke with the patient and his wife today. We discussed that he truly does not have a diagnosis of cancer as far as we can tell. Observation is a reasonable option. Radiation therapy could prevent recurrence and may offer a disease-free survival benefit. We discussed the process of simulation the making a mask. We  discussed 15 treatments as an outpatient to the right posterior irregular space as well as the level II lymph nodes. We discussed possible side effects including sore throat skin redness and hair loss. We discussed the low likelihood of secondary malignancies. He really is struggling with not having a definitive diagnosis. We discussed that there really wasn't another test or study that would allow [place] to no more. We discussed that the PET scan fortunately did not show another site of disease that we could use to obtain more tissue. After thinking about things he would like to pursue a second opinion which I think is reasonable. I have contacted the lymphoma experts at [facility] and will discuss with him an appointment there. He will call me after this appointment if he decides to proceed on with radiation. Incidentally he and his family have a trip to [place] in [place] which he would really like to be done with his treatment before that time if we do in fact elect to proceed with radiation.    Lurline Hare, MD

## 2013-01-23 ENCOUNTER — Telehealth: Payer: Self-pay | Admitting: Radiation Oncology

## 2013-01-23 NOTE — Telephone Encounter (Signed)
Per Corrie Dandy, faxed records to Dr. Royston Cowper office, [phone].  Received confirmation.  Faxed request to pathology for them to [employer] pathology slides. [phone])

## 2013-01-25 DIAGNOSIS — C8589 Other specified types of non-Hodgkin lymphoma, extranodal and solid organ sites: Secondary | ICD-10-CM | POA: Diagnosis not present

## 2013-01-28 DIAGNOSIS — C8589 Other specified types of non-Hodgkin lymphoma, extranodal and solid organ sites: Secondary | ICD-10-CM | POA: Diagnosis not present

## 2013-02-14 ENCOUNTER — Telehealth: Payer: Self-pay | Admitting: *Deleted

## 2013-02-14 NOTE — Telephone Encounter (Signed)
Returned vm from pt who states he has his path report from [place]. He wants Daniel Vasquez to review. Informed pt Daniel Vasquez is out of office until 02/19/13. Pt states he will either hand deliver or fax the report to Daniel Vasquez. Informed Daniel Vasquez will bring this to Daniel Vasquez attention when she returns to this office 02/19/13. Pt verbalized understanding.

## 2013-02-19 ENCOUNTER — Telehealth: Payer: Self-pay | Admitting: *Deleted

## 2013-02-19 NOTE — Telephone Encounter (Signed)
Per Dr Michell Heinrich spoke w/pt re: pathology report from [facility]. Informed pt that [facility] agrees w/diagnosis rendered by Dr Guerry Bruin. Also informed pt that Dr Michell Heinrich discussed path w/Dr Rhea Pink, and he agreed w/no treatment. Dr Michell Heinrich also spoke w/Dr Gaylyn Rong, medical oncologist at [facility] who agrees w/Dr Michell Heinrich and Dr Sharman Cheek. Pt verbalized understanding. He confirmed his appt for CT scan in August 2014. Informed pt that Dr Michell Heinrich will speak w/him over phone or see him in FU to discuss in person if he desires. Pt declined both stating he was comfortable with waiting until his scan in August.

## 2013-02-19 NOTE — Telephone Encounter (Signed)
[facility] path agrees with ours.  I spoke with Dr. Caroleen Hamman and agree with his assessment of no treatment. I can discuss with him over the phone or in person if he likes.

## 2013-02-26 ENCOUNTER — Encounter: Payer: Self-pay | Admitting: Oncology

## 2013-05-07 DIAGNOSIS — L819 Disorder of pigmentation, unspecified: Secondary | ICD-10-CM | POA: Diagnosis not present

## 2013-05-07 DIAGNOSIS — L578 Other skin changes due to chronic exposure to nonionizing radiation: Secondary | ICD-10-CM | POA: Diagnosis not present

## 2013-05-07 DIAGNOSIS — D1801 Hemangioma of skin and subcutaneous tissue: Secondary | ICD-10-CM | POA: Diagnosis not present

## 2013-05-07 DIAGNOSIS — D235 Other benign neoplasm of skin of trunk: Secondary | ICD-10-CM | POA: Diagnosis not present

## 2013-05-07 DIAGNOSIS — L821 Other seborrheic keratosis: Secondary | ICD-10-CM | POA: Diagnosis not present

## 2013-06-13 ENCOUNTER — Telehealth: Payer: Self-pay | Admitting: Hematology and Oncology

## 2013-06-13 NOTE — Telephone Encounter (Signed)
Moved 8/27 appt to AM due to call day. S/w pt re change and new time for 8/27 @ 10am. Also confrim lb/ct 8/26.

## 2013-06-17 DIAGNOSIS — H409 Unspecified glaucoma: Secondary | ICD-10-CM | POA: Diagnosis not present

## 2013-06-19 ENCOUNTER — Other Ambulatory Visit: Payer: Self-pay

## 2013-07-08 ENCOUNTER — Other Ambulatory Visit (HOSPITAL_BASED_OUTPATIENT_CLINIC_OR_DEPARTMENT_OTHER): Payer: Medicare Other

## 2013-07-08 ENCOUNTER — Encounter (HOSPITAL_COMMUNITY)
Admission: RE | Admit: 2013-07-08 | Discharge: 2013-07-08 | Disposition: A | Discharge status: Home / Self Care | Payer: Medicare Other | Source: Ambulatory Visit | Attending: Oncology | Admitting: Oncology

## 2013-07-08 DIAGNOSIS — M47817 Spondylosis without myelopathy or radiculopathy, lumbosacral region: Secondary | ICD-10-CM | POA: Insufficient documentation

## 2013-07-08 DIAGNOSIS — K5730 Diverticulosis of large intestine without perforation or abscess without bleeding: Secondary | ICD-10-CM | POA: Diagnosis not present

## 2013-07-08 DIAGNOSIS — N281 Cyst of kidney, acquired: Secondary | ICD-10-CM | POA: Diagnosis not present

## 2013-07-08 DIAGNOSIS — M169 Osteoarthritis of hip, unspecified: Secondary | ICD-10-CM | POA: Diagnosis not present

## 2013-07-08 DIAGNOSIS — C8589 Other specified types of non-Hodgkin lymphoma, extranodal and solid organ sites: Secondary | ICD-10-CM | POA: Diagnosis not present

## 2013-07-08 DIAGNOSIS — K4090 Unilateral inguinal hernia, without obstruction or gangrene, not specified as recurrent: Secondary | ICD-10-CM | POA: Diagnosis not present

## 2013-07-08 DIAGNOSIS — C8190 Hodgkin lymphoma, unspecified, unspecified site: Secondary | ICD-10-CM | POA: Diagnosis not present

## 2013-07-08 DIAGNOSIS — M5137 Other intervertebral disc degeneration, lumbosacral region: Secondary | ICD-10-CM | POA: Insufficient documentation

## 2013-07-08 DIAGNOSIS — C8590 Non-Hodgkin lymphoma, unspecified, unspecified site: Secondary | ICD-10-CM

## 2013-07-08 DIAGNOSIS — M51379 Other intervertebral disc degeneration, lumbosacral region without mention of lumbar back pain or lower extremity pain: Secondary | ICD-10-CM | POA: Insufficient documentation

## 2013-07-08 DIAGNOSIS — M5146 Schmorl's nodes, lumbar region: Secondary | ICD-10-CM | POA: Insufficient documentation

## 2013-07-08 DIAGNOSIS — M1610 Unilateral primary osteoarthritis, unspecified hip: Secondary | ICD-10-CM | POA: Insufficient documentation

## 2013-07-08 DIAGNOSIS — K8020 Calculus of gallbladder without cholecystitis without obstruction: Secondary | ICD-10-CM | POA: Insufficient documentation

## 2013-07-08 DIAGNOSIS — R911 Solitary pulmonary nodule: Secondary | ICD-10-CM | POA: Diagnosis not present

## 2013-07-08 LAB — CBC WITH DIFFERENTIAL/PLATELET
BASO%: 0.5 % (ref 0.0–2.0)
EOS%: 1.9 % (ref 0.0–7.0)
HCT: 46.3 % (ref 38.4–49.9)
LYMPH%: 21.1 % (ref 14.0–49.0)
MCH: 30.6 pg (ref 27.2–33.4)
MCHC: 33.7 g/dL (ref 32.0–36.0)
MONO#: 0.7 10*3/uL (ref 0.1–0.9)
NEUT%: 65.1 % (ref 39.0–75.0)
Platelets: 224 10*3/uL (ref 140–400)

## 2013-07-08 LAB — COMPREHENSIVE METABOLIC PANEL (CC13)
ALT: 27 U/L (ref 0–55)
CO2: 26 mEq/L (ref 22–29)
Creatinine: 0.8 mg/dL (ref 0.7–1.3)
Total Bilirubin: 0.68 mg/dL (ref 0.20–1.20)

## 2013-07-08 LAB — LACTATE DEHYDROGENASE (CC13): LDH: 163 U/L (ref 125–245)

## 2013-07-08 MED ORDER — DIPHENHYDRAMINE HCL 25 MG PO CAPS: 50.0000 mg | ORAL_CAPSULE | Freq: Four times a day (QID) | ORAL | Status: DC | PRN

## 2013-07-08 MED ORDER — IOHEXOL 300 MG/ML  SOLN
125.0000 mL | Freq: Once | INTRAMUSCULAR | Status: AC | PRN
  Administered 2013-07-08: 125 mL via INTRAVENOUS

## 2013-07-09 ENCOUNTER — Ambulatory Visit (HOSPITAL_COMMUNITY): Payer: Medicare Other

## 2013-07-09 ENCOUNTER — Other Ambulatory Visit: Payer: Medicare Other | Admitting: Lab

## 2013-07-09 ENCOUNTER — Ambulatory Visit (HOSPITAL_BASED_OUTPATIENT_CLINIC_OR_DEPARTMENT_OTHER): Payer: Medicare Other | Admitting: Hematology and Oncology

## 2013-07-09 VITALS — BP 125/71 | HR 64 | Temp 97.9°F | Resp 20 | Ht 72.0 in | Wt 182.0 lb

## 2013-07-09 DIAGNOSIS — C8589 Other specified types of non-Hodgkin lymphoma, extranodal and solid organ sites: Secondary | ICD-10-CM

## 2013-07-09 DIAGNOSIS — C8590 Non-Hodgkin lymphoma, unspecified, unspecified site: Secondary | ICD-10-CM

## 2013-07-10 ENCOUNTER — Telehealth: Payer: Self-pay | Admitting: Hematology and Oncology

## 2013-07-10 ENCOUNTER — Ambulatory Visit: Payer: Medicare Other

## 2013-07-10 NOTE — Telephone Encounter (Signed)
s.w. pt wife and advised on Feb 2015 appt...advised that cs will contact to sched ct and pt needs to pick up barium...ok and aware

## 2013-07-12 NOTE — Progress Notes (Signed)
IDYSIDRO RAMSAY OB: [birth year]  MR#: [id number]  AOZ#:[id number]  [facility]  [phone] [phone]   OFFICE PROGRESS NOTE  PCP: Daisy Floro, MD Referral MD: Dr. Melvenia Beam, M.D.    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 The grade of this process was not able to be assigned on this very limited tissue.   CURRENT THERAPY: Observation .    HISTORY OF PRESENT ILLNESS:  Mr. Mcadory is a 72 year-old man without significant PMH. He developed about 20-lb non intentional weight loss last year with negative work up. Earlier this year, he noticed a small nodule behind his right ear. He thought at first that it was a bug bite since it was red and slightly tender. He was given an empiric course of antibiotic without resolution. He was then referred to Dr. Emeline Darling from ENT who performed an MRI of the neck on 12/20/2012 which showed an 8mm lesion in the subcutaneous fat behind the right ear. He then underwent excisional biopsy of the mass on 12/26/1022 with path case # [id number]. It showed a few atypical lymphoid follicles characterized by ill defined mantle zones and hemogeneous composition of medium to large lymphoid cells. Flow cytometry was not contributory. ICH showed positive for CD20 and BCL-2. The final path was suspicious but not definitive for follicular NHL. He was kindly referred to the [facility] for evaluation.  Mr. Frieden presented to the [facility] for [facility] time on 01/09/2013. Dr. Gaylyn Rong suspected very early presentation of follicular Non Hogkin's lymphoma, stage 0.. The grade of this process was not able to be assigned on this very limited tissue.  Radiation versus chemotherapy versus observation was discussed and observation was recommendation:   INTERVAL HISTORY: The patient presented today for his regular follow up visit. His appetite is fine and weight is stable. He does not have complaints except some  itching in his arms. The patient denied fever, chills, night sweats. He denied headaches, double vision, blurry vision, nasal congestion, nasal discharge, hearing problems, odynophagia or dysphagia. No chest pain, palpitations, dyspnea, cough, abdominal pain, nausea, vomiting, diarrhea, constipation, hematochezia. The patient denied dysuria, nocturia, polyuria, hematuria, myalgia, numbness, tingling, psychiatric problems.  Review of Systems  Constitutional: Negative for fever, chills, weight loss, malaise/fatigue and diaphoresis.  HENT: Negative for hearing loss, ear pain, nosebleeds, congestion, sore throat, neck pain and tinnitus.   Eyes: Negative for blurred vision, double vision, photophobia, discharge and redness.  Respiratory: Negative for cough, hemoptysis, sputum production, shortness of breath and wheezing.   Cardiovascular: Negative for chest pain, palpitations, orthopnea, claudication, leg swelling and PND.  Gastrointestinal: Negative for heartburn, nausea, vomiting, abdominal pain, diarrhea, constipation and melena.  Genitourinary: Negative for dysuria, urgency, frequency, hematuria and flank pain.  Musculoskeletal: Negative for myalgias, back pain and joint pain.  Skin: Positive for itching. Negative for rash.  Neurological: Negative for dizziness, tingling, tremors, speech change, focal weakness, seizures, loss of consciousness, weakness and headaches.  Endo/Heme/Allergies: Does not bruise/bleed easily.  Psychiatric/Behavioral: Negative.    PAST MEDICAL HISTORY: Past Medical History  Diagnosis Date  . Hernia 08/2012    right inguinal seen on ct scan  . Gallstones   . BPH (benign prostatic hyperplasia)   . Glaucoma   . Non-Hodgkin lymphoma 12/28/12    B cell lymphoma, right neck mass  . Allergic rhinitis   . Skin cancer, basal cell     basal cell, squamous cell  . Diverticulosis   . Cholelithiasis  01/08/13    per pet scan    PAST SURGICAL HISTORY: Past Surgical History    Procedure Laterality Date  . Appendectomy    . Tonsillectomy    . Soft tissue mass excision  12/26/12    right neck lymph nodal tissue -suspicious for NHL  . Prostate biopsy  2003    negative  . Colonoscopy  2011    neg except for diverticulosis    FAMILY HISTORY Family History  Problem Relation Age of Onset  . Cancer Father     prostate  . Heart disease Maternal Grandmother   . Stroke Mother    HEALTH MAINTENANCE: History  Substance Use Topics  . Smoking status: Former Smoker -- 0.25 packs/day for 2 years    Quit date: 11/14/1968  . Smokeless tobacco: Never Used     Comment: in 20's, social smoker  . Alcohol Use: No     Comment: 5 drinks per week    Allergies  Allergen Reactions  . Iodinated Diagnostic Agents     Rash and hives with contrast > 19yrs ago    Current Outpatient Prescriptions  Medication Sig Dispense Refill  . aspirin 81 MG tablet Take 81 mg by mouth daily.      [name] OVER THE COUNTER MEDICATION Multi vitamin      . psyllium (METAMUCIL) 58.6 % packet Take 1 packet by mouth daily.      . travoprost, benzalkonium, (TRAVATAN) 0.004 % ophthalmic solution 1 drop at bedtime.       No current facility-administered medications for this visit.    OBJECTIVE: Filed Vitals:   07/09/13 1031  BP: 125/71  Pulse: 64  Temp: 97.9 F (36.6 C)  Resp: 20     Body mass index is 24.68 kg/(m^2).    ECOG FS:0  PHYSICAL EXAMINATION:  HEENT: Sclerae anicteric.  Conjunctivae were pink. Pupils round and reactive bilaterally. Oral mucosa is moist without ulceration or thrush. No occipital, submandibular, cervical, supraclavicular or axillar adenopathy. Lungs: clear to auscultation without wheezes. No rales or rhonchi. Heart: regular rate and rhythm. No murmur, gallop or rubs. Abdomen: soft, non tender. No guarding or rebound tenderness. Bowel sounds are present. No palpable hepatosplenomegaly. MSK: no focal spinal tenderness. Extremities: No clubbing or cyanosis.No calf  tenderness to palpitation, no peripheral edema. The patient had grossly intact strength in upper and lower extremities. Skin exam was without ecchymosis, petechiae. Neuro: non-focal, alert and oriented to time, person and place, appropriate affect  LAB RESULTS:  CMP     Component Value Date/Time   NA 141 07/08/2013 0813   K 4.2 07/08/2013 0813   CL 103 01/04/2013 1113   CO2 26 07/08/2013 0813   GLUCOSE 95 07/08/2013 0813   GLUCOSE 106* 01/04/2013 1113   BUN 18.0 07/08/2013 0813   CREATININE 0.8 07/08/2013 0813   CALCIUM 8.9 07/08/2013 0813   PROT 7.1 07/08/2013 0813   ALBUMIN 3.9 07/08/2013 0813   AST 26 07/08/2013 0813   ALT 27 07/08/2013 0813   ALKPHOS 90 07/08/2013 0813   BILITOT 0.68 07/08/2013 0813    I No results found for this basename: SPEP, UPEP,  kappa and lambda light chains    Lab Results  Component Value Date   WBC 6.4 07/08/2013   NEUTROABS 4.2 07/08/2013   HGB 15.6 07/08/2013   HCT 46.3 07/08/2013   MCV 90.7 07/08/2013   PLT 224 07/08/2013      Chemistry      Component Value Date/Time   NA  141 07/08/2013 0813   K 4.2 07/08/2013 0813   CL 103 01/04/2013 1113   CO2 26 07/08/2013 0813   BUN 18.0 07/08/2013 0813   CREATININE 0.8 07/08/2013 0813      Component Value Date/Time   CALCIUM 8.9 07/08/2013 0813   ALKPHOS 90 07/08/2013 0813   AST 26 07/08/2013 0813   ALT 27 07/08/2013 0813   BILITOT 0.68 07/08/2013 0813      STUDIES: Ct Soft Tissue Neck W Contrast  07/08/2013   CLINICAL DATA:  Non-Hodgkin's lymphoma, restaging.  EXAM: CT NECK WITH CONTRAST; CT ABDOMEN AND PELVIS WITH CONTRAST; CT CHEST WITH CONTRAST  TECHNIQUE: Multidetector CT imaging of the neck was performed with intravenous contrast.; Multidetector CT imaging of the abdomen and pelvis was performed following the standard protocol during bolus administration of intravenous contrast.; Multidetector CT imaging of the chest was performed following the standard protocol during bolus administration of intravenous  contrast.  CONTRAST:  OMNIPAQUE IOHEXOL 300 MG/ML  SOLN  COMPARISON:  Multiple exams, including 01/08/2013, 12/20/2012, and 09/04/2012  FINDINGS: CT NECK: Small bilateral station 2 lymph nodes are not pathologically enlarged by size criteria. Right supraclavicular node short axis 0 degrees 0.8 cm, image 105 of series 4.  Parapharyngeal space is unremarkable. Supraglottic and glottic tissues within normal limits. Thyroid unremarkable.  CT CHEST: A right hilar lymph node appears primarily fatty. No pathologic thoracic adenopathy observed. No pleural effusion or significant vascular abnormality identified. Minimal apical pleural parenchymal scarring noted.  Left lower lobe nodule, 0.7 x 0.4 cm, not readily apparent on the prior exam, but measuring 0.6 x 0.4 cm on the 09/04/2012 exam by my measurements.  CT ABDOMEN/PELVIS: The liver, spleen, pancreas, and adrenal glands appear normal.  A 1.9 cm calcified gallstone is present in the gallbladder.  Left peripelvic renal cysts noted.  Conglomerate nodal tissue in the left small bowel mesenteric measures 1.0 cm in short axis, essentially stable.  Sigmoid colon diverticulosis noted without active diverticulitis. Urinary bladder unremarkable.  Small inguinal hernia contains a margin of small bowel loops without strangulation or obstruction. There is also a small outpouching of the urinary bladder extending into this inguinal hernia.  Degenerative spurring and subcortical cyst formation in the acetabulum and adjacent femoral heads noted. Lumbar spondylosis and degenerative disk disease noted with loss of disc height at L2-3 and L3-4, multilevel Schmorl's nodes, and mild degenerative posterior subluxations at L2-3 and L3-4.  IMPRESSION: CT NECK:  1. No findings of malignancy in the neck.  CT CHEST:  1. Essentially stable left basilar pulmonary nodule from 09/04/2012. This documents 10 months of stability. Followup imaging in 1 year 's time is suggested. This nodule is low  enough that it would probably be included on a CT of the abdomen, if chest CT was otherwise not warranted.  CT ABDOMEN/PELVIS:  1. Cholelithiasis. 2. Nodal tissue in the left small bowel mesenteries borderline prominent at 1 cm in short axis, and likely warrants observation with followup imaging. This nodal tissue was present but not hypermetabolic on prior PET-CT from 16/08/9603. 3. Small right inguinal hernia contains a margin of small bowel loop and a small outpouching of the urinary bladder. No strangulation or obstruction. 4. Lumbar spondylosis and degenerative disc disease. Osteoarthritis of the hips.   Electronically Signed   By: Herbie Baltimore   On: 07/08/2013 10:30   Ct Chest W Contrast  07/08/2013   CLINICAL DATA:  Non-Hodgkin's lymphoma, restaging.  EXAM: CT NECK WITH CONTRAST; CT ABDOMEN AND  PELVIS WITH CONTRAST; CT CHEST WITH CONTRAST  TECHNIQUE: Multidetector CT imaging of the neck was performed with intravenous contrast.; Multidetector CT imaging of the abdomen and pelvis was performed following the standard protocol during bolus administration of intravenous contrast.; Multidetector CT imaging of the chest was performed following the standard protocol during bolus administration of intravenous contrast.  CONTRAST:  OMNIPAQUE IOHEXOL 300 MG/ML  SOLN  COMPARISON:  Multiple exams, including 01/08/2013, 12/20/2012, and 09/04/2012  FINDINGS: CT NECK: Small bilateral station 2 lymph nodes are not pathologically enlarged by size criteria. Right supraclavicular node short axis 0 degrees 0.8 cm, image 105 of series 4.  Parapharyngeal space is unremarkable. Supraglottic and glottic tissues within normal limits. Thyroid unremarkable.  CT CHEST: A right hilar lymph node appears primarily fatty. No pathologic thoracic adenopathy observed. No pleural effusion or significant vascular abnormality identified. Minimal apical pleural parenchymal scarring noted.  Left lower lobe nodule, 0.7 x 0.4 cm, not  readily apparent on the prior exam, but measuring 0.6 x 0.4 cm on the 09/04/2012 exam by my measurements.  CT ABDOMEN/PELVIS: The liver, spleen, pancreas, and adrenal glands appear normal.  A 1.9 cm calcified gallstone is present in the gallbladder.  Left peripelvic renal cysts noted.  Conglomerate nodal tissue in the left small bowel mesenteric measures 1.0 cm in short axis, essentially stable.  Sigmoid colon diverticulosis noted without active diverticulitis. Urinary bladder unremarkable.  Small inguinal hernia contains a margin of small bowel loops without strangulation or obstruction. There is also a small outpouching of the urinary bladder extending into this inguinal hernia.  Degenerative spurring and subcortical cyst formation in the acetabulum and adjacent femoral heads noted. Lumbar spondylosis and degenerative disk disease noted with loss of disc height at L2-3 and L3-4, multilevel Schmorl's nodes, and mild degenerative posterior subluxations at L2-3 and L3-4.  IMPRESSION: CT NECK:  1. No findings of malignancy in the neck.  CT CHEST:  1. Essentially stable left basilar pulmonary nodule from 09/04/2012. This documents 10 months of stability. Followup imaging in 1 year 's time is suggested. This nodule is low enough that it would probably be included on a CT of the abdomen, if chest CT was otherwise not warranted.  CT ABDOMEN/PELVIS:  1. Cholelithiasis. 2. Nodal tissue in the left small bowel mesenteries borderline prominent at 1 cm in short axis, and likely warrants observation with followup imaging. This nodal tissue was present but not hypermetabolic on prior PET-CT from 16/08/9603. 3. Small right inguinal hernia contains a margin of small bowel loop and a small outpouching of the urinary bladder. No strangulation or obstruction. 4. Lumbar spondylosis and degenerative disc disease. Osteoarthritis of the hips.   Electronically Signed   By: Herbie Baltimore   On: 07/08/2013 10:30   Ct Abdomen Pelvis W  Contrast  07/08/2013   CLINICAL DATA:  Non-Hodgkin's lymphoma, restaging.  EXAM: CT NECK WITH CONTRAST; CT ABDOMEN AND PELVIS WITH CONTRAST; CT CHEST WITH CONTRAST  TECHNIQUE: Multidetector CT imaging of the neck was performed with intravenous contrast.; Multidetector CT imaging of the abdomen and pelvis was performed following the standard protocol during bolus administration of intravenous contrast.; Multidetector CT imaging of the chest was performed following the standard protocol during bolus administration of intravenous contrast.  CONTRAST:  OMNIPAQUE IOHEXOL 300 MG/ML  SOLN  COMPARISON:  Multiple exams, including 01/08/2013, 12/20/2012, and 09/04/2012  FINDINGS: CT NECK: Small bilateral station 2 lymph nodes are not pathologically enlarged by size criteria. Right supraclavicular node short axis 0  degrees 0.8 cm, image 105 of series 4.  Parapharyngeal space is unremarkable. Supraglottic and glottic tissues within normal limits. Thyroid unremarkable.  CT CHEST: A right hilar lymph node appears primarily fatty. No pathologic thoracic adenopathy observed. No pleural effusion or significant vascular abnormality identified. Minimal apical pleural parenchymal scarring noted.  Left lower lobe nodule, 0.7 x 0.4 cm, not readily apparent on the prior exam, but measuring 0.6 x 0.4 cm on the 09/04/2012 exam by my measurements.  CT ABDOMEN/PELVIS: The liver, spleen, pancreas, and adrenal glands appear normal.  A 1.9 cm calcified gallstone is present in the gallbladder.  Left peripelvic renal cysts noted.  Conglomerate nodal tissue in the left small bowel mesenteric measures 1.0 cm in short axis, essentially stable.  Sigmoid colon diverticulosis noted without active diverticulitis. Urinary bladder unremarkable.  Small inguinal hernia contains a margin of small bowel loops without strangulation or obstruction. There is also a small outpouching of the urinary bladder extending into this inguinal hernia.  Degenerative  spurring and subcortical cyst formation in the acetabulum and adjacent femoral heads noted. Lumbar spondylosis and degenerative disk disease noted with loss of disc height at L2-3 and L3-4, multilevel Schmorl's nodes, and mild degenerative posterior subluxations at L2-3 and L3-4.  IMPRESSION: CT NECK:  1. No findings of malignancy in the neck.  CT CHEST:  1. Essentially stable left basilar pulmonary nodule from 09/04/2012. This documents 10 months of stability. Followup imaging in 1 year 's time is suggested. This nodule is low enough that it would probably be included on a CT of the abdomen, if chest CT was otherwise not warranted.  CT ABDOMEN/PELVIS:  1. Cholelithiasis. 2. Nodal tissue in the left small bowel mesenteries borderline prominent at 1 cm in short axis, and likely warrants observation with followup imaging. This nodal tissue was present but not hypermetabolic on prior PET-CT from 16/08/9603. 3. Small right inguinal hernia contains a margin of small bowel loop and a small outpouching of the urinary bladder. No strangulation or obstruction. 4. Lumbar spondylosis and degenerative disc disease. Osteoarthritis of the hips.   Electronically Signed   By: Herbie Baltimore   On: 07/08/2013 10:30    ASSESSMENT AND PLAN: 1. Very early presentation of follicular Non Hogkin's lymphoma stage 0. The grade of this process was not able to be assigned on this very limited tissue.  On  Observation.  CT NECK shows no findings of malignancy in the neck.  CT CHEST reveals essentially stable left basilar pulmonary nodule from 09/04/2012.   CT ABDOMEN/PELVIS shows nodal tissue in the left small bowel mesenteries borderline prominent at 1 cm in short axis, and likely warrants observation with followup imaging. This nodal tissue was present but not hypermetabolic on prior PET-CT from 54/07/8118.  No evidence of lymphoma at present time. 4. Follow up: Repeat CT scan in about 6 months and follow up the next day but sooner  if concerning symptoms.    Myra Rude, MD   07/10/2013 12:02 AM

## 2013-08-03 DIAGNOSIS — Z23 Encounter for immunization: Secondary | ICD-10-CM | POA: Diagnosis not present

## 2013-09-19 ENCOUNTER — Other Ambulatory Visit: Payer: Self-pay

## 2013-12-19 DIAGNOSIS — H4011X0 Primary open-angle glaucoma, stage unspecified: Secondary | ICD-10-CM | POA: Diagnosis not present

## 2013-12-19 DIAGNOSIS — H409 Unspecified glaucoma: Secondary | ICD-10-CM | POA: Diagnosis not present

## 2013-12-20 ENCOUNTER — Other Ambulatory Visit: Payer: Self-pay | Admitting: Hematology and Oncology

## 2013-12-20 DIAGNOSIS — C8590 Non-Hodgkin lymphoma, unspecified, unspecified site: Secondary | ICD-10-CM

## 2014-01-07 ENCOUNTER — Ambulatory Visit (HOSPITAL_COMMUNITY)
Admission: RE | Admit: 2014-01-07 | Discharge: 2014-01-07 | Disposition: A | Discharge status: Home / Self Care | Payer: Medicare Other | Source: Ambulatory Visit | Attending: Hematology and Oncology | Admitting: Hematology and Oncology

## 2014-01-07 ENCOUNTER — Other Ambulatory Visit (HOSPITAL_BASED_OUTPATIENT_CLINIC_OR_DEPARTMENT_OTHER): Payer: Medicare Other

## 2014-01-07 ENCOUNTER — Encounter (HOSPITAL_COMMUNITY): Payer: Self-pay

## 2014-01-07 DIAGNOSIS — R911 Solitary pulmonary nodule: Secondary | ICD-10-CM | POA: Diagnosis not present

## 2014-01-07 DIAGNOSIS — C8589 Other specified types of non-Hodgkin lymphoma, extranodal and solid organ sites: Secondary | ICD-10-CM | POA: Insufficient documentation

## 2014-01-07 DIAGNOSIS — C8590 Non-Hodgkin lymphoma, unspecified, unspecified site: Secondary | ICD-10-CM

## 2014-01-07 DIAGNOSIS — K5730 Diverticulosis of large intestine without perforation or abscess without bleeding: Secondary | ICD-10-CM | POA: Diagnosis not present

## 2014-01-07 DIAGNOSIS — R599 Enlarged lymph nodes, unspecified: Secondary | ICD-10-CM | POA: Diagnosis not present

## 2014-01-07 LAB — COMPREHENSIVE METABOLIC PANEL (CC13)
ALBUMIN: 4.3 g/dL (ref 3.5–5.0)
ALK PHOS: 80 U/L (ref 40–150)
ALT: 28 U/L (ref 0–55)
AST: 23 U/L (ref 5–34)
Anion Gap: 8 mEq/L (ref 3–11)
BILIRUBIN TOTAL: 0.53 mg/dL (ref 0.20–1.20)
BUN: 15.9 mg/dL (ref 7.0–26.0)
CO2: 28 mEq/L (ref 22–29)
CREATININE: 0.9 mg/dL (ref 0.7–1.3)
Calcium: 9.4 mg/dL (ref 8.4–10.4)
Chloride: 104 mEq/L (ref 98–109)
GLUCOSE: 100 mg/dL (ref 70–140)
POTASSIUM: 4.4 meq/L (ref 3.5–5.1)
Sodium: 140 mEq/L (ref 136–145)
Total Protein: 7.4 g/dL (ref 6.4–8.3)

## 2014-01-07 LAB — CBC WITH DIFFERENTIAL/PLATELET
BASO%: 0.8 % (ref 0.0–2.0)
BASOS ABS: 0 10*3/uL (ref 0.0–0.1)
EOS ABS: 0.1 10*3/uL (ref 0.0–0.5)
EOS%: 2 % (ref 0.0–7.0)
HCT: 48.5 % (ref 38.4–49.9)
HEMOGLOBIN: 16 g/dL (ref 13.0–17.1)
LYMPH%: 28 % (ref 14.0–49.0)
MCH: 30.2 pg (ref 27.2–33.4)
MCHC: 33 g/dL (ref 32.0–36.0)
MCV: 91.7 fL (ref 79.3–98.0)
MONO#: 0.7 10*3/uL (ref 0.1–0.9)
MONO%: 12.6 % (ref 0.0–14.0)
NEUT#: 3 10*3/uL (ref 1.5–6.5)
NEUT%: 56.6 % (ref 39.0–75.0)
PLATELETS: 247 10*3/uL (ref 140–400)
RBC: 5.28 10*6/uL (ref 4.20–5.82)
RDW: 13.4 % (ref 11.0–14.6)
WBC: 5.3 10*3/uL (ref 4.0–10.3)
lymph#: 1.5 10*3/uL (ref 0.9–3.3)

## 2014-01-07 LAB — LACTATE DEHYDROGENASE (CC13): LDH: 158 U/L (ref 125–245)

## 2014-01-07 MED ORDER — IOHEXOL 300 MG/ML  SOLN
100.0000 mL | Freq: Once | INTRAMUSCULAR | Status: AC | PRN
  Administered 2014-01-07: 100 mL via INTRAVENOUS

## 2014-01-08 ENCOUNTER — Encounter: Payer: Self-pay | Admitting: Hematology and Oncology

## 2014-01-08 ENCOUNTER — Ambulatory Visit (HOSPITAL_BASED_OUTPATIENT_CLINIC_OR_DEPARTMENT_OTHER): Payer: Medicare Other | Admitting: Hematology and Oncology

## 2014-01-08 VITALS — BP 130/80 | HR 62 | Temp 96.9°F | Resp 18 | Ht 72.0 in | Wt 190.7 lb

## 2014-01-08 DIAGNOSIS — Z23 Encounter for immunization: Secondary | ICD-10-CM | POA: Diagnosis not present

## 2014-01-08 DIAGNOSIS — C8589 Other specified types of non-Hodgkin lymphoma, extranodal and solid organ sites: Secondary | ICD-10-CM | POA: Diagnosis not present

## 2014-01-08 DIAGNOSIS — C8590 Non-Hodgkin lymphoma, unspecified, unspecified site: Secondary | ICD-10-CM

## 2014-01-08 HISTORY — DX: Encounter for immunization: Z23

## 2014-01-08 MED ORDER — PNEUMOCOCCAL VAC POLYVALENT 25 MCG/0.5ML IJ INJ
0.5000 mL | INJECTION | INTRAMUSCULAR | Status: AC
  Administered 2014-01-08: 0.5 mL via INTRAMUSCULAR
  Filled 2014-01-08: qty 0.5

## 2014-01-08 NOTE — Progress Notes (Signed)
[facility] OFFICE PROGRESS NOTE  Patient Care Team: Melinda Crutch, MD as PCP - General (Family Medicine) Ruby Cola, MD as Attending Physician (Otolaryngology) Thea Silversmith, MD (Radiation Oncology) Marylynn Pearson, MD as Attending Physician (Ophthalmology) Leandrew Koyanagi (Urology)  DIAGNOSIS: Low-grade follicular lymphoma grade 1  SUMMARY OF ONCOLOGIC HISTORY: This is a pleasant 73 year old patient who developed 20 pound weight loss last year that prompted further evaluation. Summary of his history is as follows: Oncology History   Non-Hodgkin lymphoma   Primary site: Lymphoid Neoplasms   Clinical: Stage I signed by Heath Lark, MD on 01/08/2014 10:33 AM   Pathologic: Stage I signed by Heath Lark, MD on 01/08/2014 10:33 AM   Summary: Stage I       Non-Hodgkin lymphoma   12/20/2012 Imaging MRI showed 8 mm right subcutaneous lesion is most compatible with a small benign appearing lymph node.    12/26/2012 Surgery He underwent excisional lymph node biopsy for palpable mass   01/08/2013 Imaging PET/CT scan was negative   07/08/2013 Imaging CT scan was negative   01/07/2014 Imaging CT scan was negative     INTERVAL HISTORY: Daniel Vasquez 73 y.o. male returns for further followup. He is not symptomatic. He denies any recent infections. Denies any palpable lymphadenopathy. He denies any recent fever, chills, night sweats or abnormal weight loss  I have reviewed the past medical history, past surgical history, social history and family history with the patient and they are unchanged from previous note.  ALLERGIES:  is allergic to iodinated diagnostic agents.  MEDICATIONS:  Current Outpatient Prescriptions  Medication Sig Dispense Refill  . aspirin 81 MG tablet Take 81 mg by mouth daily.      [name] OVER THE COUNTER MEDICATION Multi vitamin      . psyllium (METAMUCIL) 58.6 % packet Take 1 packet by mouth daily.      . travoprost, benzalkonium, (TRAVATAN) 0.004 %  ophthalmic solution 1 drop at bedtime.       No current facility-administered medications for this visit.    REVIEW OF SYSTEMS:   Constitutional: Denies fevers, chills or abnormal weight loss Eyes: Denies blurriness of vision Ears, nose, mouth, throat, and face: Denies mucositis or sore throat Respiratory: Denies cough, dyspnea or wheezes Cardiovascular: Denies palpitation, chest discomfort or lower extremity swelling Gastrointestinal:  Denies nausea, heartburn or change in bowel habits Skin: Denies abnormal skin rashes Lymphatics: Denies new lymphadenopathy or easy bruising Neurological:Denies numbness, tingling or new weaknesses Behavioral/Psych: Mood is stable, no new changes  All other systems were reviewed with the patient and are negative.  PHYSICAL EXAMINATION: ECOG PERFORMANCE STATUS: 0 - Asymptomatic  Filed Vitals:   01/08/14 0920  BP: 130/80  Pulse: 62  Temp: 96.9 F (36.1 C)  Resp: 18   Filed Weights   01/08/14 0920  Weight: 190 lb 11.2 oz (86.501 kg)    GENERAL:alert, no distress and comfortable SKIN: skin color, texture, turgor are normal, no rashes or significant lesions EYES: normal, Conjunctiva are pink and non-injected, sclera clear OROPHARYNX:no exudate, no erythema and lips, buccal mucosa, and tongue normal  NECK: supple, thyroid normal size, non-tender, without nodularity LYMPH:  no palpable lymphadenopathy in the cervical, axillary or inguinal LUNGS: clear to auscultation and percussion with normal breathing effort HEART: regular rate & rhythm and no murmurs and no lower extremity edema ABDOMEN:abdomen soft, non-tender and normal bowel sounds Musculoskeletal:no cyanosis of digits and no clubbing  NEURO: alert & oriented x 3 with fluent speech, no  focal motor/sensory deficits  LABORATORY DATA:  I have reviewed the data as listed    Component Value Date/Time   NA 140 01/07/2014 0809   K 4.4 01/07/2014 0809   CL 103 01/04/2013 1113   CO2 28  01/07/2014 0809   GLUCOSE 100 01/07/2014 0809   GLUCOSE 106* 01/04/2013 1113   BUN 15.9 01/07/2014 0809   CREATININE 0.9 01/07/2014 0809   CALCIUM 9.4 01/07/2014 0809   PROT 7.4 01/07/2014 0809   ALBUMIN 4.3 01/07/2014 0809   AST 23 01/07/2014 0809   ALT 28 01/07/2014 0809   ALKPHOS 80 01/07/2014 0809   BILITOT 0.53 01/07/2014 0809    No results found for this basename: SPEP, UPEP,  kappa and lambda light chains    Lab Results  Component Value Date   WBC 5.3 01/07/2014   NEUTROABS 3.0 01/07/2014   HGB 16.0 01/07/2014   HCT 48.5 01/07/2014   MCV 91.7 01/07/2014   PLT 247 01/07/2014      Chemistry      Component Value Date/Time   NA 140 01/07/2014 0809   K 4.4 01/07/2014 0809   CL 103 01/04/2013 1113   CO2 28 01/07/2014 0809   BUN 15.9 01/07/2014 0809   CREATININE 0.9 01/07/2014 0809      Component Value Date/Time   CALCIUM 9.4 01/07/2014 0809   ALKPHOS 80 01/07/2014 0809   AST 23 01/07/2014 0809   ALT 28 01/07/2014 0809   BILITOT 0.53 01/07/2014 0809       RADIOGRAPHIC STUDIES: I reviewed all the imaging study with the patient and and his wife I have personally reviewed the radiological images as listed and agreed with the findings in the report. Ct Soft Tissue Neck W Contrast  01/07/2014   CLINICAL DATA:  Non-Hodgkin's lymphoma diagnosed 2014.  EXAM: CT ABDOMEN AND PELVIS WITH CONTRAST; CT CHEST WITH CONTRAST; CT NECK WITH CONTRAST  TECHNIQUE: Multidetector CT imaging of the neck was performed with intravenous contrast.; Multidetector CT imaging of the abdomen and pelvis was performed following the standard protocol during bolus administration of intravenous contrast.; Multidetector CT imaging of the chest was performed following the standard protocol during bolus administration of intravenous contrast.  CONTRAST:  133mL OMNIPAQUE IOHEXOL 300 MG/ML  SOLN  COMPARISON:  CT ABD/PELVIS W CM dated 07/08/2013; NM PET IMAGE INITIAL (PI) SKULL BASE TO THIGH dated 01/08/2013; CT ABD/PELV WO CM dated  09/04/2012  FINDINGS: CT NECK FINDINGS  No pathologically enlarged lymph nodes along the cervical chains. No submental adenopathy. Salivary glands are normal. The pharyngeal mucosa is symmetric. Limited view of the brain is normal. Orbits are normal.  Review of the skeleton demonstrates no aggressive osseous lesion  CT CHEST FINDINGS  No axillary or supraclavicular lymphadenopathy. No mediastinal hilar lymphadenopathy. No pericardial fluid. Esophagus normal. Small 11 mmright hilar lymph node is unchanged.  Review of the lung parenchyma demonstrates a stable small 4 mm nodule at the left lung base not changed over multiple comparison exams. Mild nodular pleural parenchymal thickening at the right lung base is new compared to prior (image 42, series 5). No new pulmonary parenchymal nodules.  CT ABDOMEN AND PELVIS FINDINGS  No focal hepatic lesion. Large gallstone within the gallbladder. Pancreas, spleen, adrenal glands, kidneys are normal.  Stomach, small bowel, small bowel, and colon are unremarkable. Mild diverticular of the left colon.  There are several prominent mesenteric lymph nodes in the left abdomen which are less than 10 mm short axis and unchanged from prior. No  retroperitoneal lymphadenopathy. No periportal lymphadenopathy.  No pelvic lymphadenopathy or inguinal lymphadenopathy. Prostate gland and bladder normal. No aggressive osseous lesion.  IMPRESSION: 1. No evidence of lymphoma recurrence within the neck, chest, abdomen, or pelvis. 2. Mild new pleural nodular thickening in the right lower lobe is likely post infectious or inflammatory. Recommend attention on follow-up. 3. Stable small left lower lobe pulmonary nodule. 4. Small mesenteric lymph nodes are stable compared to prior.   Electronically Signed   By: Suzy Bouchard M.D.   On: 01/07/2014 10:39   Ct Chest W Contrast  01/07/2014   CLINICAL DATA:  Non-Hodgkin's lymphoma diagnosed 2014.  EXAM: CT ABDOMEN AND PELVIS WITH CONTRAST; CT CHEST WITH  CONTRAST; CT NECK WITH CONTRAST  TECHNIQUE: Multidetector CT imaging of the neck was performed with intravenous contrast.; Multidetector CT imaging of the abdomen and pelvis was performed following the standard protocol during bolus administration of intravenous contrast.; Multidetector CT imaging of the chest was performed following the standard protocol during bolus administration of intravenous contrast.  CONTRAST:  144mL OMNIPAQUE IOHEXOL 300 MG/ML  SOLN  COMPARISON:  CT ABD/PELVIS W CM dated 07/08/2013; NM PET IMAGE INITIAL (PI) SKULL BASE TO THIGH dated 01/08/2013; CT ABD/PELV WO CM dated 09/04/2012  FINDINGS: CT NECK FINDINGS  No pathologically enlarged lymph nodes along the cervical chains. No submental adenopathy. Salivary glands are normal. The pharyngeal mucosa is symmetric. Limited view of the brain is normal. Orbits are normal.  Review of the skeleton demonstrates no aggressive osseous lesion  CT CHEST FINDINGS  No axillary or supraclavicular lymphadenopathy. No mediastinal hilar lymphadenopathy. No pericardial fluid. Esophagus normal. Small 11 mmright hilar lymph node is unchanged.  Review of the lung parenchyma demonstrates a stable small 4 mm nodule at the left lung base not changed over multiple comparison exams. Mild nodular pleural parenchymal thickening at the right lung base is new compared to prior (image 42, series 5). No new pulmonary parenchymal nodules.  CT ABDOMEN AND PELVIS FINDINGS  No focal hepatic lesion. Large gallstone within the gallbladder. Pancreas, spleen, adrenal glands, kidneys are normal.  Stomach, small bowel, small bowel, and colon are unremarkable. Mild diverticular of the left colon.  There are several prominent mesenteric lymph nodes in the left abdomen which are less than 10 mm short axis and unchanged from prior. No retroperitoneal lymphadenopathy. No periportal lymphadenopathy.  No pelvic lymphadenopathy or inguinal lymphadenopathy. Prostate gland and bladder normal. No  aggressive osseous lesion.  IMPRESSION: 1. No evidence of lymphoma recurrence within the neck, chest, abdomen, or pelvis. 2. Mild new pleural nodular thickening in the right lower lobe is likely post infectious or inflammatory. Recommend attention on follow-up. 3. Stable small left lower lobe pulmonary nodule. 4. Small mesenteric lymph nodes are stable compared to prior.   Electronically Signed   By: Suzy Bouchard M.D.   On: 01/07/2014 10:39   Ct Abdomen Pelvis W Contrast  01/07/2014   CLINICAL DATA:  Non-Hodgkin's lymphoma diagnosed 2014.  EXAM: CT ABDOMEN AND PELVIS WITH CONTRAST; CT CHEST WITH CONTRAST; CT NECK WITH CONTRAST  TECHNIQUE: Multidetector CT imaging of the neck was performed with intravenous contrast.; Multidetector CT imaging of the abdomen and pelvis was performed following the standard protocol during bolus administration of intravenous contrast.; Multidetector CT imaging of the chest was performed following the standard protocol during bolus administration of intravenous contrast.  CONTRAST:  126mL OMNIPAQUE IOHEXOL 300 MG/ML  SOLN  COMPARISON:  CT ABD/PELVIS W CM dated 07/08/2013; NM PET IMAGE INITIAL (PI) SKULL  BASE TO THIGH dated 01/08/2013; CT ABD/PELV WO CM dated 09/04/2012  FINDINGS: CT NECK FINDINGS  No pathologically enlarged lymph nodes along the cervical chains. No submental adenopathy. Salivary glands are normal. The pharyngeal mucosa is symmetric. Limited view of the brain is normal. Orbits are normal.  Review of the skeleton demonstrates no aggressive osseous lesion  CT CHEST FINDINGS  No axillary or supraclavicular lymphadenopathy. No mediastinal hilar lymphadenopathy. No pericardial fluid. Esophagus normal. Small 11 mmright hilar lymph node is unchanged.  Review of the lung parenchyma demonstrates a stable small 4 mm nodule at the left lung base not changed over multiple comparison exams. Mild nodular pleural parenchymal thickening at the right lung base is new compared to prior  (image 42, series 5). No new pulmonary parenchymal nodules.  CT ABDOMEN AND PELVIS FINDINGS  No focal hepatic lesion. Large gallstone within the gallbladder. Pancreas, spleen, adrenal glands, kidneys are normal.  Stomach, small bowel, small bowel, and colon are unremarkable. Mild diverticular of the left colon.  There are several prominent mesenteric lymph nodes in the left abdomen which are less than 10 mm short axis and unchanged from prior. No retroperitoneal lymphadenopathy. No periportal lymphadenopathy.  No pelvic lymphadenopathy or inguinal lymphadenopathy. Prostate gland and bladder normal. No aggressive osseous lesion.  IMPRESSION: 1. No evidence of lymphoma recurrence within the neck, chest, abdomen, or pelvis. 2. Mild new pleural nodular thickening in the right lower lobe is likely post infectious or inflammatory. Recommend attention on follow-up. 3. Stable small left lower lobe pulmonary nodule. 4. Small mesenteric lymph nodes are stable compared to prior.   Electronically Signed   By: Suzy Bouchard M.D.   On: 01/07/2014 10:39      ASSESSMENT & PLAN:  #1 low-grade follicular lymphoma The patient has minimum disease. I recommend history, physical examination and blood work once a year. There is no benefits to order imaging studies unless the patient has symptoms to suggest disease progression. I spent a lot of time educating the patient and his wife signs and symptoms to watch out for possible disease recurrence. #2 history of skin cancer I recommend yearly dermatology followup #3 gallstone disease I discussed with him dietary modification. At present time there is no indication for cholecystectomy #4 preventive care The patient is up-to-date with screening program. I proceed with pneumonia vaccination today. I recommend vitamin D supplementation.  Orders Placed This Encounter  Procedures  . CBC with Differential    Standing Status: Future     Number of Occurrences:      Standing  Expiration Date: 01/08/2015  . Comprehensive metabolic panel    Standing Status: Future     Number of Occurrences:      Standing Expiration Date: 01/08/2015   All questions were answered. The patient knows to call the clinic with any problems, questions or concerns. No barriers to learning was detected. I spent 25 minutes counseling the patient face to face. The total time spent in the appointment was 40 minutes and more than 50% was on counseling and review of test results     [facility], [place], MD 01/08/2014 10:33 AM

## 2014-01-09 ENCOUNTER — Telehealth: Payer: Self-pay | Admitting: Hematology and Oncology

## 2014-01-09 NOTE — Telephone Encounter (Signed)
s.w. pt and advised on March 2016 appt.Marland KitchenMarland KitchenMarland KitchenMarland Kitchenpt aware of appt

## 2014-01-13 DIAGNOSIS — R109 Unspecified abdominal pain: Secondary | ICD-10-CM | POA: Diagnosis not present

## 2014-01-13 DIAGNOSIS — R82998 Other abnormal findings in urine: Secondary | ICD-10-CM | POA: Diagnosis not present

## 2014-01-14 ENCOUNTER — Telehealth: Payer: Self-pay | Admitting: Hematology and Oncology

## 2014-01-14 NOTE — Telephone Encounter (Signed)
Faxed pt medical records to [facility] @ [place]

## 2014-02-28 DIAGNOSIS — Z23 Encounter for immunization: Secondary | ICD-10-CM | POA: Diagnosis not present

## 2014-02-28 DIAGNOSIS — Z87898 Personal history of other specified conditions: Secondary | ICD-10-CM | POA: Diagnosis not present

## 2014-02-28 DIAGNOSIS — Z136 Encounter for screening for cardiovascular disorders: Secondary | ICD-10-CM | POA: Diagnosis not present

## 2014-02-28 DIAGNOSIS — Z0000 Encounter for general adult medical examination without abnormal findings: Secondary | ICD-10-CM | POA: Diagnosis not present

## 2014-05-13 DIAGNOSIS — H47239 Glaucomatous optic atrophy, unspecified eye: Secondary | ICD-10-CM | POA: Diagnosis not present

## 2014-05-21 DIAGNOSIS — L918 Other hypertrophic disorders of the skin: Secondary | ICD-10-CM | POA: Diagnosis not present

## 2014-05-21 DIAGNOSIS — L819 Disorder of pigmentation, unspecified: Secondary | ICD-10-CM | POA: Diagnosis not present

## 2014-05-21 DIAGNOSIS — L908 Other atrophic disorders of skin: Secondary | ICD-10-CM | POA: Diagnosis not present

## 2014-05-21 DIAGNOSIS — D235 Other benign neoplasm of skin of trunk: Secondary | ICD-10-CM | POA: Diagnosis not present

## 2014-05-21 DIAGNOSIS — D1801 Hemangioma of skin and subcutaneous tissue: Secondary | ICD-10-CM | POA: Diagnosis not present

## 2014-05-21 DIAGNOSIS — L821 Other seborrheic keratosis: Secondary | ICD-10-CM | POA: Diagnosis not present

## 2014-05-21 DIAGNOSIS — L570 Actinic keratosis: Secondary | ICD-10-CM | POA: Diagnosis not present

## 2014-08-13 DIAGNOSIS — H4011X0 Primary open-angle glaucoma, stage unspecified: Secondary | ICD-10-CM | POA: Diagnosis not present

## 2014-08-13 DIAGNOSIS — H2510 Age-related nuclear cataract, unspecified eye: Secondary | ICD-10-CM | POA: Diagnosis not present

## 2014-09-11 DIAGNOSIS — Z23 Encounter for immunization: Secondary | ICD-10-CM | POA: Diagnosis not present

## 2014-10-27 DIAGNOSIS — M6530 Trigger finger, unspecified finger: Secondary | ICD-10-CM | POA: Diagnosis not present

## 2014-10-27 DIAGNOSIS — M67942 Unspecified disorder of synovium and tendon, left hand: Secondary | ICD-10-CM | POA: Diagnosis not present

## 2014-10-31 DIAGNOSIS — M659 Synovitis and tenosynovitis, unspecified: Secondary | ICD-10-CM | POA: Diagnosis not present

## 2014-10-31 DIAGNOSIS — M79645 Pain in left finger(s): Secondary | ICD-10-CM | POA: Diagnosis not present

## 2014-10-31 DIAGNOSIS — M25642 Stiffness of left hand, not elsewhere classified: Secondary | ICD-10-CM | POA: Diagnosis not present

## 2014-10-31 DIAGNOSIS — M7989 Other specified soft tissue disorders: Secondary | ICD-10-CM | POA: Diagnosis not present

## 2014-11-10 DIAGNOSIS — M79645 Pain in left finger(s): Secondary | ICD-10-CM | POA: Diagnosis not present

## 2015-01-13 ENCOUNTER — Telehealth: Payer: Self-pay | Admitting: Hematology and Oncology

## 2015-01-13 ENCOUNTER — Other Ambulatory Visit (HOSPITAL_BASED_OUTPATIENT_CLINIC_OR_DEPARTMENT_OTHER): Payer: Medicare Other

## 2015-01-13 ENCOUNTER — Ambulatory Visit (HOSPITAL_BASED_OUTPATIENT_CLINIC_OR_DEPARTMENT_OTHER): Payer: Medicare Other | Admitting: Hematology and Oncology

## 2015-01-13 ENCOUNTER — Encounter: Payer: Self-pay | Admitting: Hematology and Oncology

## 2015-01-13 VITALS — BP 114/76 | HR 66 | Temp 98.1°F | Resp 18 | Ht 72.0 in | Wt 188.8 lb

## 2015-01-13 DIAGNOSIS — C8590 Non-Hodgkin lymphoma, unspecified, unspecified site: Secondary | ICD-10-CM

## 2015-01-13 LAB — COMPREHENSIVE METABOLIC PANEL (CC13)
ALT: 28 U/L (ref 0–55)
AST: 34 U/L (ref 5–34)
Albumin: 3.9 g/dL (ref 3.5–5.0)
Alkaline Phosphatase: 75 U/L (ref 40–150)
Anion Gap: 11 mEq/L (ref 3–11)
BUN: 14.5 mg/dL (ref 7.0–26.0)
CALCIUM: 8.7 mg/dL (ref 8.4–10.4)
CO2: 24 mEq/L (ref 22–29)
Chloride: 104 mEq/L (ref 98–109)
Creatinine: 0.9 mg/dL (ref 0.7–1.3)
EGFR: 86 mL/min/{1.73_m2} — ABNORMAL LOW (ref >90)
Glucose: 96 mg/dl (ref 70–140)
Potassium: 4.4 mEq/L (ref 3.5–5.1)
SODIUM: 139 meq/L (ref 136–145)
Total Bilirubin: 0.59 mg/dL (ref 0.20–1.20)
Total Protein: 6.4 g/dL (ref 6.4–8.3)

## 2015-01-13 LAB — CBC WITH DIFFERENTIAL/PLATELET
BASO%: 0.6 % (ref 0.0–2.0)
BASOS ABS: 0 10*3/uL (ref 0.0–0.1)
EOS ABS: 0.1 10*3/uL (ref 0.0–0.5)
EOS%: 1.1 % (ref 0.0–7.0)
HCT: 45.4 % (ref 38.4–49.9)
HEMOGLOBIN: 14.9 g/dL (ref 13.0–17.1)
LYMPH%: 22.8 % (ref 14.0–49.0)
MCH: 29.8 pg (ref 27.2–33.4)
MCHC: 32.9 g/dL (ref 32.0–36.0)
MCV: 90.5 fL (ref 79.3–98.0)
MONO#: 0.7 10*3/uL (ref 0.1–0.9)
MONO%: 12.6 % (ref 0.0–14.0)
NEUT%: 62.9 % (ref 39.0–75.0)
NEUTROS ABS: 3.7 10*3/uL (ref 1.5–6.5)
Platelets: 273 10*3/uL (ref 140–400)
RBC: 5.02 10*6/uL (ref 4.20–5.82)
RDW: 13.4 % (ref 11.0–14.6)
WBC: 5.9 10*3/uL (ref 4.0–10.3)
lymph#: 1.4 10*3/uL (ref 0.9–3.3)

## 2015-01-13 LAB — LACTATE DEHYDROGENASE (CC13): LDH: 162 U/L (ref 125–245)

## 2015-01-13 NOTE — Progress Notes (Signed)
[place] OFFICE PROGRESS NOTE  Patient Care Team: Melinda Crutch, MD as PCP - General (Family Medicine) Ruby Cola, MD as Attending Physician (Otolaryngology) Thea Silversmith, MD (Radiation Oncology) Marylynn Pearson, MD as Attending Physician (Ophthalmology) Leandrew Koyanagi, MD (Urology)  SUMMARY OF ONCOLOGIC HISTORY: Oncology History   Non-Hodgkin lymphoma   Primary site: Lymphoid Neoplasms   Clinical: Stage I signed by Heath Lark, MD on 01/08/2014 10:33 AM   Pathologic: Stage I signed by Heath Lark, MD on 01/08/2014 10:33 AM   Summary: Stage I       Non-Hodgkin lymphoma   12/20/2012 Imaging MRI showed 8 mm right subcutaneous lesion is most compatible with a small benign appearing lymph node.    12/26/2012 Surgery He underwent excisional lymph node biopsy for palpable mass   01/08/2013 Imaging PET/CT scan was negative   07/08/2013 Imaging CT scan was negative   01/07/2014 Imaging CT scan was negative    INTERVAL HISTORY: Please see below for problem oriented charting. He is seen today for routine follow-up. He has intermittent lymphadenopathy the base of his neck which resolve spontaneously. Nothing new recently.  REVIEW OF SYSTEMS:   Constitutional: Denies fevers, chills or abnormal weight loss Eyes: Denies blurriness of vision Ears, nose, mouth, throat, and face: Denies mucositis or sore throat Respiratory: Denies cough, dyspnea or wheezes Cardiovascular: Denies palpitation, chest discomfort or lower extremity swelling Gastrointestinal:  Denies nausea, heartburn or change in bowel habits Skin: Denies abnormal skin rashes Lymphatics: Denies new lymphadenopathy or easy bruising Neurological:Denies numbness, tingling or new weaknesses Behavioral/Psych: Mood is stable, no new changes  All other systems were reviewed with the patient and are negative.  I have reviewed the past medical history, past surgical history, social history and family history with the patient  and they are unchanged from previous note.  ALLERGIES:  is allergic to iodinated diagnostic agents.  MEDICATIONS:  Current Outpatient Prescriptions  Medication Sig Dispense Refill  . aspirin 81 MG tablet Take 81 mg by mouth daily.    . Cholecalciferol (VITAMIN D PO) Take by mouth daily.    [name] OVER THE COUNTER MEDICATION Multi vitamin    . psyllium (METAMUCIL) 58.6 % packet Take 1 packet by mouth daily.    . travoprost, benzalkonium, (TRAVATAN) 0.004 % ophthalmic solution 1 drop at bedtime.     No current facility-administered medications for this visit.    PHYSICAL EXAMINATION: ECOG PERFORMANCE STATUS: 0 - Asymptomatic  Filed Vitals:   01/13/15 1244  BP: 114/76  Pulse: 66  Temp: 98.1 F (36.7 C)  Resp: 18   Filed Weights   01/13/15 1244  Weight: 188 lb 12.8 oz (85.639 kg)    GENERAL:alert, no distress and comfortable SKIN: skin color, texture, turgor are normal, no rashes or significant lesions EYES: normal, Conjunctiva are pink and non-injected, sclera clear OROPHARYNX:no exudate, no erythema and lips, buccal mucosa, and tongue normal  NECK: supple, thyroid normal size, non-tender, without nodularity LYMPH:  no palpable lymphadenopathy in the cervical, axillary or inguinal LUNGS: clear to auscultation and percussion with normal breathing effort HEART: regular rate & rhythm and no murmurs and no lower extremity edema ABDOMEN:abdomen soft, non-tender and normal bowel sounds Musculoskeletal:no cyanosis of digits and no clubbing  NEURO: alert & oriented x 3 with fluent speech, no focal motor/sensory deficits  LABORATORY DATA:  I have reviewed the data as listed    Component Value Date/Time   NA 140 01/07/2014 0809   K 4.4 01/07/2014 0809  CL 103 01/04/2013 1113   CO2 28 01/07/2014 0809   GLUCOSE 100 01/07/2014 0809   GLUCOSE 106* 01/04/2013 1113   BUN 15.9 01/07/2014 0809   CREATININE 0.9 01/07/2014 0809   CALCIUM 9.4 01/07/2014 0809   PROT 7.4 01/07/2014 0809    ALBUMIN 4.3 01/07/2014 0809   AST 23 01/07/2014 0809   ALT 28 01/07/2014 0809   ALKPHOS 80 01/07/2014 0809   BILITOT 0.53 01/07/2014 0809    No results found for: SPEP, UPEP  Lab Results  Component Value Date   WBC 5.9 01/13/2015   NEUTROABS 3.7 01/13/2015   HGB 14.9 01/13/2015   HCT 45.4 01/13/2015   MCV 90.5 01/13/2015   PLT 273 01/13/2015      Chemistry      Component Value Date/Time   NA 140 01/07/2014 0809   K 4.4 01/07/2014 0809   CL 103 01/04/2013 1113   CO2 28 01/07/2014 0809   BUN 15.9 01/07/2014 0809   CREATININE 0.9 01/07/2014 0809      Component Value Date/Time   CALCIUM 9.4 01/07/2014 0809   ALKPHOS 80 01/07/2014 0809   AST 23 01/07/2014 0809   ALT 28 01/07/2014 0809   BILITOT 0.53 01/07/2014 0809     ASSESSMENT & PLAN:  Non-Hodgkin lymphoma Clinically, he is no signs of recurrence. The patient is educated to watch out for signs and symptoms of disease recurrence. We will continue yearly visit with history, physical examination and blood work only    Orders Placed This Encounter  Procedures  . CBC with Differential/Platelet    Standing Status: Future     Number of Occurrences: 1     Standing Expiration Date: 02/17/2016  . Comprehensive metabolic panel    Standing Status: Future     Number of Occurrences: 1     Standing Expiration Date: 02/17/2016  . Lactate dehydrogenase    Standing Status: Future     Number of Occurrences: 1     Standing Expiration Date: 02/17/2016  . CBC with Differential/Platelet    Standing Status: Future     Number of Occurrences:      Standing Expiration Date: 02/17/2016  . Comprehensive metabolic panel    Standing Status: Future     Number of Occurrences:      Standing Expiration Date: 02/17/2016  . Lactate dehydrogenase    Standing Status: Future     Number of Occurrences:      Standing Expiration Date: 02/17/2016   All questions were answered. The patient knows to call the clinic with any problems, questions or  concerns. No barriers to learning was detected. I spent 15 minutes counseling the patient face to face. The total time spent in the appointment was 20 minutes and more than 50% was on counseling and review of test results     [facility], [place], MD 01/13/2015 1:01 PM

## 2015-01-13 NOTE — Assessment & Plan Note (Signed)
Clinically, he is no signs of recurrence. The patient is educated to watch out for signs and symptoms of disease recurrence. We will continue yearly visit with history, physical examination and blood work only

## 2015-01-13 NOTE — Telephone Encounter (Signed)
lvm for pt regarding to March 2017 appt.Marland KitchenMarland KitchenMarland Kitchen

## 2015-01-14 DIAGNOSIS — M65841 Other synovitis and tenosynovitis, right hand: Secondary | ICD-10-CM | POA: Diagnosis not present

## 2015-01-14 DIAGNOSIS — M65842 Other synovitis and tenosynovitis, left hand: Secondary | ICD-10-CM | POA: Diagnosis not present

## 2015-01-21 DIAGNOSIS — M79605 Pain in left leg: Secondary | ICD-10-CM | POA: Diagnosis not present

## 2015-02-11 DIAGNOSIS — H4011X2 Primary open-angle glaucoma, moderate stage: Secondary | ICD-10-CM | POA: Diagnosis not present

## 2015-02-17 DIAGNOSIS — M65842 Other synovitis and tenosynovitis, left hand: Secondary | ICD-10-CM | POA: Diagnosis not present

## 2015-02-17 DIAGNOSIS — M65841 Other synovitis and tenosynovitis, right hand: Secondary | ICD-10-CM | POA: Diagnosis not present

## 2015-03-16 DIAGNOSIS — Z131 Encounter for screening for diabetes mellitus: Secondary | ICD-10-CM | POA: Diagnosis not present

## 2015-03-16 DIAGNOSIS — Z0000 Encounter for general adult medical examination without abnormal findings: Secondary | ICD-10-CM | POA: Diagnosis not present

## 2015-03-16 DIAGNOSIS — Z8042 Family history of malignant neoplasm of prostate: Secondary | ICD-10-CM | POA: Diagnosis not present

## 2015-03-16 DIAGNOSIS — Z23 Encounter for immunization: Secondary | ICD-10-CM | POA: Diagnosis not present

## 2015-03-16 DIAGNOSIS — A4901 Methicillin susceptible Staphylococcus aureus infection, unspecified site: Secondary | ICD-10-CM | POA: Diagnosis not present

## 2015-03-16 DIAGNOSIS — Z125 Encounter for screening for malignant neoplasm of prostate: Secondary | ICD-10-CM | POA: Diagnosis not present

## 2015-05-26 DIAGNOSIS — Z85828 Personal history of other malignant neoplasm of skin: Secondary | ICD-10-CM | POA: Diagnosis not present

## 2015-05-26 DIAGNOSIS — D3617 Benign neoplasm of peripheral nerves and autonomic nervous system of trunk, unspecified: Secondary | ICD-10-CM | POA: Diagnosis not present

## 2015-05-26 DIAGNOSIS — L821 Other seborrheic keratosis: Secondary | ICD-10-CM | POA: Diagnosis not present

## 2015-05-26 DIAGNOSIS — L814 Other melanin hyperpigmentation: Secondary | ICD-10-CM | POA: Diagnosis not present

## 2015-05-26 DIAGNOSIS — D1801 Hemangioma of skin and subcutaneous tissue: Secondary | ICD-10-CM | POA: Diagnosis not present

## 2015-05-26 DIAGNOSIS — D225 Melanocytic nevi of trunk: Secondary | ICD-10-CM | POA: Diagnosis not present

## 2015-08-05 DIAGNOSIS — Z23 Encounter for immunization: Secondary | ICD-10-CM | POA: Diagnosis not present

## 2015-10-13 DIAGNOSIS — R350 Frequency of micturition: Secondary | ICD-10-CM | POA: Diagnosis not present

## 2015-10-13 DIAGNOSIS — R351 Nocturia: Secondary | ICD-10-CM | POA: Diagnosis not present

## 2015-11-02 DIAGNOSIS — A4901 Methicillin susceptible Staphylococcus aureus infection, unspecified site: Secondary | ICD-10-CM | POA: Diagnosis not present

## 2015-12-15 DIAGNOSIS — H2513 Age-related nuclear cataract, bilateral: Secondary | ICD-10-CM | POA: Diagnosis not present

## 2015-12-15 DIAGNOSIS — H401131 Primary open-angle glaucoma, bilateral, mild stage: Secondary | ICD-10-CM | POA: Diagnosis not present

## 2015-12-30 DIAGNOSIS — M65842 Other synovitis and tenosynovitis, left hand: Secondary | ICD-10-CM | POA: Diagnosis not present

## 2015-12-30 DIAGNOSIS — M65841 Other synovitis and tenosynovitis, right hand: Secondary | ICD-10-CM | POA: Diagnosis not present

## 2016-01-12 DIAGNOSIS — M65341 Trigger finger, right ring finger: Secondary | ICD-10-CM | POA: Diagnosis not present

## 2016-01-12 DIAGNOSIS — M65331 Trigger finger, right middle finger: Secondary | ICD-10-CM | POA: Diagnosis not present

## 2016-01-14 ENCOUNTER — Ambulatory Visit (HOSPITAL_BASED_OUTPATIENT_CLINIC_OR_DEPARTMENT_OTHER): Payer: Medicare Other | Admitting: Hematology and Oncology

## 2016-01-14 ENCOUNTER — Other Ambulatory Visit (HOSPITAL_BASED_OUTPATIENT_CLINIC_OR_DEPARTMENT_OTHER): Payer: Medicare Other

## 2016-01-14 ENCOUNTER — Encounter: Payer: Self-pay | Admitting: Hematology and Oncology

## 2016-01-14 VITALS — BP 134/77 | HR 63 | Temp 97.6°F | Resp 18 | Wt 181.9 lb

## 2016-01-14 DIAGNOSIS — Z8572 Personal history of non-Hodgkin lymphomas: Secondary | ICD-10-CM

## 2016-01-14 DIAGNOSIS — C8590 Non-Hodgkin lymphoma, unspecified, unspecified site: Secondary | ICD-10-CM

## 2016-01-14 HISTORY — DX: Personal history of non-Hodgkin lymphomas: Z85.72

## 2016-01-14 LAB — CBC WITH DIFFERENTIAL/PLATELET
BASO%: 0.4 % (ref 0.0–2.0)
BASOS ABS: 0 10*3/uL (ref 0.0–0.1)
EOS ABS: 0.1 10*3/uL (ref 0.0–0.5)
EOS%: 1.6 % (ref 0.0–7.0)
HCT: 45.4 % (ref 38.4–49.9)
HEMOGLOBIN: 15.6 g/dL (ref 13.0–17.1)
LYMPH%: 26.8 % (ref 14.0–49.0)
MCH: 30.5 pg (ref 27.2–33.4)
MCHC: 34.4 g/dL (ref 32.0–36.0)
MCV: 88.7 fL (ref 79.3–98.0)
MONO#: 0.6 10*3/uL (ref 0.1–0.9)
MONO%: 11.5 % (ref 0.0–14.0)
NEUT#: 3.1 10*3/uL (ref 1.5–6.5)
NEUT%: 59.7 % (ref 39.0–75.0)
Platelets: 243 10*3/uL (ref 140–400)
RBC: 5.12 10*6/uL (ref 4.20–5.82)
RDW: 12.9 % (ref 11.0–14.6)
WBC: 5.1 10*3/uL (ref 4.0–10.3)
lymph#: 1.4 10*3/uL (ref 0.9–3.3)

## 2016-01-14 LAB — COMPREHENSIVE METABOLIC PANEL
ALBUMIN: 4.1 g/dL (ref 3.5–5.0)
ALK PHOS: 82 U/L (ref 40–150)
ALT: 24 U/L (ref 0–55)
ANION GAP: 6 meq/L (ref 3–11)
AST: 21 U/L (ref 5–34)
BUN: 17.8 mg/dL (ref 7.0–26.0)
CALCIUM: 9.1 mg/dL (ref 8.4–10.4)
CO2: 29 mEq/L (ref 22–29)
Chloride: 104 mEq/L (ref 98–109)
Creatinine: 0.9 mg/dL (ref 0.7–1.3)
EGFR: 85 mL/min/{1.73_m2} — AB (ref >90)
GLUCOSE: 103 mg/dL (ref 70–140)
Potassium: 4.8 mEq/L (ref 3.5–5.1)
SODIUM: 139 meq/L (ref 136–145)
Total Bilirubin: 0.76 mg/dL (ref 0.20–1.20)
Total Protein: 7.1 g/dL (ref 6.4–8.3)

## 2016-01-14 LAB — LACTATE DEHYDROGENASE: LDH: 150 U/L (ref 125–245)

## 2016-01-14 NOTE — Progress Notes (Signed)
[facility] OFFICE PROGRESS NOTE  Patient Care Team: Lona Kettle, MD as PCP - General (Family Medicine) Ruby Cola, MD as Attending Physician (Otolaryngology) Thea Silversmith, MD (Radiation Oncology) Marylynn Pearson, MD as Attending Physician (Ophthalmology) Leandrew Koyanagi, MD (Urology)  SUMMARY OF ONCOLOGIC HISTORY: Oncology History   Non-Hodgkin lymphoma   Primary site: Lymphoid Neoplasms   Clinical: Stage I signed by Heath Lark, MD on 01/08/2014 10:33 AM   Pathologic: Stage I signed by Heath Lark, MD on 01/08/2014 10:33 AM   Summary: Stage I       Non-Hodgkin lymphoma ([place])   12/20/2012 Imaging MRI showed 8 mm right subcutaneous lesion is most compatible with a small benign appearing lymph node.    12/26/2012 Surgery He underwent excisional lymph node biopsy for palpable mass   01/08/2013 Imaging PET/CT scan was negative   07/08/2013 Imaging CT scan was negative   01/07/2014 Imaging CT scan was negative    INTERVAL HISTORY: Please see below for problem oriented charting. He feels well. Denies new lymphadenopathy. Denies recent infection  REVIEW OF SYSTEMS:   Constitutional: Denies fevers, chills or abnormal weight loss Eyes: Denies blurriness of vision Ears, nose, mouth, throat, and face: Denies mucositis or sore throat Respiratory: Denies cough, dyspnea or wheezes Cardiovascular: Denies palpitation, chest discomfort or lower extremity swelling Gastrointestinal:  Denies nausea, heartburn or change in bowel habits Skin: Denies abnormal skin rashes Lymphatics: Denies new lymphadenopathy or easy bruising Neurological:Denies numbness, tingling or new weaknesses Behavioral/Psych: Mood is stable, no new changes  All other systems were reviewed with the patient and are negative.  I have reviewed the past medical history, past surgical history, social history and family history with the patient and they are unchanged from previous note.  ALLERGIES:  is allergic  to iodinated diagnostic agents.  MEDICATIONS:  Current Outpatient Prescriptions  Medication Sig Dispense Refill  . aspirin 81 MG tablet Take 81 mg by mouth daily.    . Cholecalciferol (VITAMIN D PO) Take by mouth daily.    [name] OVER THE COUNTER MEDICATION Multi vitamin    . psyllium (METAMUCIL) 58.6 % packet Take 1 packet by mouth daily.    . travoprost, benzalkonium, (TRAVATAN) 0.004 % ophthalmic solution 1 drop at bedtime.     No current facility-administered medications for this visit.    PHYSICAL EXAMINATION: ECOG PERFORMANCE STATUS: 0 - Asymptomatic  Filed Vitals:   01/14/16 1012  BP: 134/77  Pulse: 63  Temp: 97.6 F (36.4 C)  Resp: 18   Filed Weights   01/14/16 1012  Weight: 181 lb 14.4 oz (82.509 kg)    GENERAL:alert, no distress and comfortable SKIN: skin color, texture, turgor are normal, no rashes or significant lesions EYES: normal, Conjunctiva are pink and non-injected, sclera clear OROPHARYNX:no exudate, no erythema and lips, buccal mucosa, and tongue normal  NECK: supple, thyroid normal size, non-tender, without nodularity LYMPH:  no palpable lymphadenopathy in the cervical, axillary or inguinal LUNGS: clear to auscultation and percussion with normal breathing effort HEART: regular rate & rhythm and no murmurs and no lower extremity edema ABDOMEN:abdomen soft, non-tender and normal bowel sounds Musculoskeletal:no cyanosis of digits and no clubbing  NEURO: alert & oriented x 3 with fluent speech, no focal motor/sensory deficits  LABORATORY DATA:  I have reviewed the data as listed    Component Value Date/Time   NA 139 01/13/2015 1235   K 4.4 01/13/2015 1235   CL 103 01/04/2013 1113   CO2 24 01/13/2015 1235  GLUCOSE 96 01/13/2015 1235   GLUCOSE 106* 01/04/2013 1113   BUN 14.5 01/13/2015 1235   CREATININE 0.9 01/13/2015 1235   CALCIUM 8.7 01/13/2015 1235   PROT 6.4 01/13/2015 1235   ALBUMIN 3.9 01/13/2015 1235   AST 34 01/13/2015 1235   ALT 28  01/13/2015 1235   ALKPHOS 75 01/13/2015 1235   BILITOT 0.59 01/13/2015 1235    No results found for: SPEP, UPEP  Lab Results  Component Value Date   WBC 5.1 01/14/2016   NEUTROABS 3.1 01/14/2016   HGB 15.6 01/14/2016   HCT 45.4 01/14/2016   MCV 88.7 01/14/2016   PLT 243 01/14/2016      Chemistry      Component Value Date/Time   NA 139 01/13/2015 1235   K 4.4 01/13/2015 1235   CL 103 01/04/2013 1113   CO2 24 01/13/2015 1235   BUN 14.5 01/13/2015 1235   CREATININE 0.9 01/13/2015 1235      Component Value Date/Time   CALCIUM 8.7 01/13/2015 1235   ALKPHOS 75 01/13/2015 1235   AST 34 01/13/2015 1235   ALT 28 01/13/2015 1235   BILITOT 0.59 01/13/2015 1235       ASSESSMENT & PLAN:  History of B-cell lymphoma Clinically, he is no signs of recurrence. I discussed with him the natural history of low-grade non-Hodgkin B-cell lymphoma. The patient is educated to watch out for signs and symptoms of disease recurrence. I offered the patient potential discharge from the cancer center as his prognosis is excellent. After extensive discussion, he would like to continue yearly visit here with history, physical examination and blood work only     Orders Placed This Encounter  Procedures  . CBC with Differential/Platelet    Standing Status: Future     Number of Occurrences:      Standing Expiration Date: 02/17/2017   All questions were answered. The patient knows to call the clinic with any problems, questions or concerns. No barriers to learning was detected. I spent 15 minutes counseling the patient face to face. The total time spent in the appointment was 20 minutes and more than 50% was on counseling and review of test results     [facility], [place], MD 01/14/2016 10:41 AM

## 2016-01-14 NOTE — Assessment & Plan Note (Signed)
Clinically, he is no signs of recurrence. I discussed with him the natural history of low-grade non-Hodgkin B-cell lymphoma. The patient is educated to watch out for signs and symptoms of disease recurrence. I offered the patient potential discharge from the cancer center as his prognosis is excellent. After extensive discussion, he would like to continue yearly visit here with history, physical examination and blood work only

## 2016-02-02 DIAGNOSIS — S0511XA Contusion of eyeball and orbital tissues, right eye, initial encounter: Secondary | ICD-10-CM | POA: Diagnosis not present

## 2016-05-31 DIAGNOSIS — D1801 Hemangioma of skin and subcutaneous tissue: Secondary | ICD-10-CM | POA: Diagnosis not present

## 2016-05-31 DIAGNOSIS — L821 Other seborrheic keratosis: Secondary | ICD-10-CM | POA: Diagnosis not present

## 2016-05-31 DIAGNOSIS — L814 Other melanin hyperpigmentation: Secondary | ICD-10-CM | POA: Diagnosis not present

## 2016-05-31 DIAGNOSIS — L570 Actinic keratosis: Secondary | ICD-10-CM | POA: Diagnosis not present

## 2016-05-31 DIAGNOSIS — D2261 Melanocytic nevi of right upper limb, including shoulder: Secondary | ICD-10-CM | POA: Diagnosis not present

## 2016-05-31 DIAGNOSIS — D225 Melanocytic nevi of trunk: Secondary | ICD-10-CM | POA: Diagnosis not present

## 2016-05-31 DIAGNOSIS — D3617 Benign neoplasm of peripheral nerves and autonomic nervous system of trunk, unspecified: Secondary | ICD-10-CM | POA: Diagnosis not present

## 2016-05-31 DIAGNOSIS — L918 Other hypertrophic disorders of the skin: Secondary | ICD-10-CM | POA: Diagnosis not present

## 2016-06-07 DIAGNOSIS — M25561 Pain in right knee: Secondary | ICD-10-CM | POA: Diagnosis not present

## 2016-06-07 DIAGNOSIS — M25552 Pain in left hip: Secondary | ICD-10-CM | POA: Diagnosis not present

## 2016-06-09 DIAGNOSIS — M25429 Effusion, unspecified elbow: Secondary | ICD-10-CM | POA: Diagnosis not present

## 2016-06-14 DIAGNOSIS — H1010 Acute atopic conjunctivitis, unspecified eye: Secondary | ICD-10-CM | POA: Diagnosis not present

## 2016-06-14 DIAGNOSIS — H401131 Primary open-angle glaucoma, bilateral, mild stage: Secondary | ICD-10-CM | POA: Diagnosis not present

## 2016-07-30 DIAGNOSIS — H1131 Conjunctival hemorrhage, right eye: Secondary | ICD-10-CM | POA: Diagnosis not present

## 2016-07-30 DIAGNOSIS — H409 Unspecified glaucoma: Secondary | ICD-10-CM | POA: Diagnosis not present

## 2016-08-09 DIAGNOSIS — Z23 Encounter for immunization: Secondary | ICD-10-CM | POA: Diagnosis not present

## 2016-08-09 DIAGNOSIS — R634 Abnormal weight loss: Secondary | ICD-10-CM | POA: Diagnosis not present

## 2016-08-09 DIAGNOSIS — Z0000 Encounter for general adult medical examination without abnormal findings: Secondary | ICD-10-CM | POA: Diagnosis not present

## 2016-08-09 DIAGNOSIS — H409 Unspecified glaucoma: Secondary | ICD-10-CM | POA: Diagnosis not present

## 2016-08-09 DIAGNOSIS — Z8572 Personal history of non-Hodgkin lymphomas: Secondary | ICD-10-CM | POA: Diagnosis not present

## 2016-08-09 DIAGNOSIS — Z125 Encounter for screening for malignant neoplasm of prostate: Secondary | ICD-10-CM | POA: Diagnosis not present

## 2016-09-06 DIAGNOSIS — Z125 Encounter for screening for malignant neoplasm of prostate: Secondary | ICD-10-CM | POA: Diagnosis not present

## 2016-09-13 ENCOUNTER — Encounter: Payer: Self-pay | Admitting: Allergy and Immunology

## 2016-09-13 ENCOUNTER — Ambulatory Visit (INDEPENDENT_AMBULATORY_CARE_PROVIDER_SITE_OTHER): Payer: Medicare Other | Admitting: Allergy and Immunology

## 2016-09-13 VITALS — BP 116/74 | HR 72 | Temp 97.8°F | Resp 14 | Ht 72.4 in | Wt 178.6 lb

## 2016-09-13 DIAGNOSIS — H1013 Acute atopic conjunctivitis, bilateral: Secondary | ICD-10-CM

## 2016-09-13 DIAGNOSIS — J3089 Other allergic rhinitis: Secondary | ICD-10-CM | POA: Diagnosis not present

## 2016-09-13 DIAGNOSIS — H1045 Other chronic allergic conjunctivitis: Secondary | ICD-10-CM

## 2016-09-13 MED ORDER — OLOPATADINE HCL 0.7 % OP SOLN: 1.0000 [drp] | Freq: Every day | OPHTHALMIC | 5 refills | Status: DC | PRN

## 2016-09-13 MED ORDER — METHYLPREDNISOLONE ACETATE 80 MG/ML IJ SUSP
80.0000 mg | Freq: Once | INTRAMUSCULAR | Status: AC
  Administered 2016-09-13: 80 mg via INTRAMUSCULAR

## 2016-09-13 MED ORDER — MONTELUKAST SODIUM 10 MG PO TABS: 10.0000 mg | ORAL_TABLET | Freq: Every day | ORAL | 5 refills | Status: DC

## 2016-09-13 NOTE — Progress Notes (Signed)
Dear Dr. Radene Ou,  Thank you for referring Daniel Vasquez to the [facility] on 09/13/2016.   Below is a summation of this patient's evaluation and recommendations.  Thank you for your referral. I will keep you informed about this patient's response to treatment.   If you have any questions please to do hesitate to contact me.   Sincerely,  Jiles Prows, MD [facility]   ______________________________________________________________________    NEW PATIENT NOTE  Referring Provider: Aretta Nip, MD Primary Provider: Melinda Crutch, MD Date of office visit: 09/13/2016    Subjective:   Chief Complaint:  Daniel Vasquez (DOB: 1941/10/04) is a 75 y.o. male who presents to the clinic on 09/13/2016 with a chief complaint of Itchy Eye .     HPI: Daniel Vasquez presents to this clinic in evaluation of problems with his eyes. He has a long history of allergic rhinitis and conjunctivitis dating back greater than 20 years for which he was skin tested at that point in time and found to be allergic to cats and various other aeroallergens. Without doubt Cat exposure would give rise to very significant eye irritation and swelling along with some nasal symptoms.  He has gone through many years with intermittent issues involving his eyes with redness and itchiness that he can usually get under control with eyedrops administered for a week or 2. However, for the past 2 months he has not been able to get control of his eye issue. He has lots itchy eyes and finds it very difficult to not itch his eyes and has lots or redness by the end of the day. He did visit with his ophthalmologist who recommended that he use Alaway eyedrops and an antihistamine which have been less than optimal in resolving this issue. He does not have any associated systemic or constitutional symptoms. There is no environmental trigger that  is easily identified giving rise to this increased activity over the course of the past 2 months.  Past Medical History:  Diagnosis Date  . Allergic rhinitis   . BPH (benign prostatic hyperplasia)   . Cholelithiasis 01/08/13   per pet scan  . Diverticulosis   . Gallstones   . Glaucoma   . Hernia 08/2012   right inguinal seen on ct scan  . History of B-cell lymphoma 01/14/2016  . Need for vaccination for Strep pneumoniae 01/08/2014  . Non-Hodgkin lymphoma ([place]) 12/28/12   B cell lymphoma, right neck mass  . Skin cancer, basal cell    basal cell, squamous cell    Past Surgical History:  Procedure Laterality Date  . APPENDECTOMY    . COLONOSCOPY  2011   neg except for diverticulosis  . PROSTATE BIOPSY  2003   negative  . SOFT TISSUE MASS EXCISION  12/26/12   right neck lymph nodal tissue -suspicious for NHL  . TONSILLECTOMY        Medication List      ALAWAY OP Apply to eye as needed.   aspirin 81 MG tablet Take 81 mg by mouth daily.   multivitamin tablet Take 1 tablet by mouth daily.   travoprost (benzalkonium) 0.004 % ophthalmic solution Commonly known as:  TRAVATAN 1 drop at bedtime.   VITAMIN B 12 PO Take by mouth daily.       Allergies  Allergen Reactions  . Iodinated Diagnostic Agents     Rash and hives with  contrast > 28yrs ago    Review of systems negative except as noted in HPI / PMHx or noted below:  Review of Systems  Constitutional: Negative.   HENT: Negative.   Eyes: Negative.   Respiratory: Negative.   Cardiovascular: Negative.   Gastrointestinal: Negative.   Genitourinary: Negative.   Musculoskeletal: Negative.   Skin: Negative.   Neurological: Negative.   Endo/Heme/Allergies: Negative.   Psychiatric/Behavioral: Negative.     Family History  Problem Relation Age of Onset  . Cancer Father     prostate  . Heart disease Maternal Grandmother   . Stroke Mother     Social History   Social History  . Marital status: Married     Spouse name: N/A  . Number of children: 3  . Years of education: N/A   Occupational History  .      retired Chief Financial Officer, Network engineer   Social History Main Topics  . Smoking status: Former Smoker    Packs/day: 0.25    Years: 2.00    Quit date: 11/14/1968  . Smokeless tobacco: Never Used     Comment: in 20's, social smoker  . Alcohol use No     Comment: 5 drinks per week  . Drug use: No  . Sexual activity: Not on file   Other Topics Concern  . Not on file   Social History Narrative   Married, Optometrist, retired Counselling psychologist and Social history  Lives in a house with a dry environment, no animals located inside the household, carpeting in the bedroom, no plastic on the bed or pillow, and no smoking ongoing with inside the household  Objective:   Vitals:   09/13/16 1425  BP: 116/74  Pulse: 72  Resp: 14  Temp: 97.8 F (36.6 C)   Height: 6' 0.4" (183.9 cm) Weight: 178 lb 9.6 oz (81 kg)  Physical Exam  Constitutional: He is well-developed, well-nourished, and in no distress.  HENT:  Head: Normocephalic. Head is without right periorbital erythema and without left periorbital erythema.  Right Ear: Tympanic membrane, external ear and ear canal normal.  Left Ear: Tympanic membrane, external ear and ear canal normal.  Nose: Nose normal. No mucosal edema or rhinorrhea.  Mouth/Throat: Oropharynx is clear and moist and mucous membranes are normal. No oropharyngeal exudate.  Eyes: Lids are normal. Pupils are equal, round, and reactive to light. Right conjunctiva is injected. Left conjunctiva is injected.  Neck: Trachea normal. No tracheal deviation present. No thyromegaly present.  Cardiovascular: Normal rate, regular rhythm, S1 normal, S2 normal and normal heart sounds.   No murmur heard. Pulmonary/Chest: Effort normal. No stridor. No tachypnea. No respiratory distress. He has no wheezes. He has no rales. He exhibits no tenderness.  Abdominal: Soft. He  exhibits no distension and no mass. There is no hepatosplenomegaly. There is no tenderness. There is no rebound and no guarding.  Musculoskeletal: He exhibits no edema or tenderness.  Lymphadenopathy:       Head (right side): No tonsillar adenopathy present.       Head (left side): No tonsillar adenopathy present.    He has no cervical adenopathy.    He has no axillary adenopathy.  Neurological: He is alert. Gait normal.  Skin: Rash (Slight facial erythema and telangiectasia face) noted. He is not diaphoretic. No erythema. No pallor. Nails show no clubbing.  Psychiatric: Mood and affect normal.    Diagnostics: Allergy skin tests were performed. He demonstrated hypersensitivity against grasses, weeds, trees  including seizure tree, house dust mite, and cat.  Assessment and Plan:    1. Perennial allergic conjunctivitis of both eyes   2. Other allergic rhinitis     1. Allergen avoidance measures  2. Never ever rub or touch eyes  3. Treat and prevent inflammation:   A. Depo-Medrol 80 IM delivered in clinic  B. montelukast 10 mg tablet 1 time per day  C. OTC Rhinocort one spray each nostril once a day  4. If needed:   A. Pazeo one drop each eye one time per day  B. OTC antihistamine  C. Systane eyedrops multiple times a day  5. Return to clinic in 4 weeks or earlier if problem  6. Further evaluation and treatment?  I will assume at this point in time that Daniel Vasquez is having significant problems with his eyes secondary to atopic disease and we will have him perform allergen avoidance measures and use a collection of anti-inflammatory medications as specified above. There is the possibility that his eye issue may also be tied up with a contact dermatitis directed against his glaucoma eyedrop and possibly secondary to acrylic exposure from his eyeglasses and he may also be having a problem with ocular rosacea. Before pursuing investigation or therapy for these issues were just going to  see how he does with attention towards atopic disease and regroup with him in 4 weeks.  Jiles Prows, MD [facility]

## 2016-09-13 NOTE — Patient Instructions (Signed)
  1. Allergen avoidance measures  2. Never ever rub or touch eyes  3. Treat and prevent inflammation:   A. Depo-Medrol 80 IM delivered in clinic  B. montelukast 10 mg tablet 1 time per day  C. OTC Rhinocort one spray each nostril once a day  4. If needed:   A. Pazeo one drop each eye one time per day  B. OTC antihistamine  C. Systane eyedrops multiple times a day  5. Return to clinic in 4 weeks or earlier if problem  6. Further evaluation and treatment?

## 2016-10-11 ENCOUNTER — Ambulatory Visit: Payer: Medicare Other | Admitting: Allergy and Immunology

## 2016-10-28 DIAGNOSIS — M65341 Trigger finger, right ring finger: Secondary | ICD-10-CM | POA: Diagnosis not present

## 2016-11-28 DIAGNOSIS — M65841 Other synovitis and tenosynovitis, right hand: Secondary | ICD-10-CM | POA: Diagnosis not present

## 2016-11-28 DIAGNOSIS — M65331 Trigger finger, right middle finger: Secondary | ICD-10-CM | POA: Diagnosis not present

## 2016-11-28 DIAGNOSIS — M65341 Trigger finger, right ring finger: Secondary | ICD-10-CM | POA: Diagnosis not present

## 2016-12-09 DIAGNOSIS — Z4789 Encounter for other orthopedic aftercare: Secondary | ICD-10-CM | POA: Diagnosis not present

## 2016-12-09 DIAGNOSIS — M65341 Trigger finger, right ring finger: Secondary | ICD-10-CM | POA: Diagnosis not present

## 2016-12-09 DIAGNOSIS — M65331 Trigger finger, right middle finger: Secondary | ICD-10-CM | POA: Diagnosis not present

## 2016-12-15 DIAGNOSIS — H2513 Age-related nuclear cataract, bilateral: Secondary | ICD-10-CM | POA: Diagnosis not present

## 2016-12-15 DIAGNOSIS — H401131 Primary open-angle glaucoma, bilateral, mild stage: Secondary | ICD-10-CM | POA: Diagnosis not present

## 2017-01-01 DIAGNOSIS — J111 Influenza due to unidentified influenza virus with other respiratory manifestations: Secondary | ICD-10-CM | POA: Diagnosis not present

## 2017-01-13 ENCOUNTER — Telehealth: Payer: Self-pay | Admitting: Hematology and Oncology

## 2017-01-13 ENCOUNTER — Other Ambulatory Visit (HOSPITAL_BASED_OUTPATIENT_CLINIC_OR_DEPARTMENT_OTHER): Payer: Medicare Other

## 2017-01-13 ENCOUNTER — Ambulatory Visit (HOSPITAL_BASED_OUTPATIENT_CLINIC_OR_DEPARTMENT_OTHER): Payer: Medicare Other | Admitting: Hematology and Oncology

## 2017-01-13 ENCOUNTER — Encounter: Payer: Self-pay | Admitting: Hematology and Oncology

## 2017-01-13 ENCOUNTER — Other Ambulatory Visit: Payer: Self-pay | Admitting: Hematology and Oncology

## 2017-01-13 DIAGNOSIS — R590 Localized enlarged lymph nodes: Secondary | ICD-10-CM | POA: Diagnosis not present

## 2017-01-13 DIAGNOSIS — Z8572 Personal history of non-Hodgkin lymphomas: Secondary | ICD-10-CM

## 2017-01-13 DIAGNOSIS — Z1509 Genetic susceptibility to other malignant neoplasm: Secondary | ICD-10-CM

## 2017-01-13 DIAGNOSIS — Z8579 Personal history of other malignant neoplasms of lymphoid, hematopoietic and related tissues: Secondary | ICD-10-CM

## 2017-01-13 DIAGNOSIS — C4491 Basal cell carcinoma of skin, unspecified: Secondary | ICD-10-CM

## 2017-01-13 DIAGNOSIS — R591 Generalized enlarged lymph nodes: Secondary | ICD-10-CM

## 2017-01-13 LAB — COMPREHENSIVE METABOLIC PANEL
ALBUMIN: 4 g/dL (ref 3.5–5.0)
ALK PHOS: 97 U/L (ref 40–150)
ALT: 34 U/L (ref 0–55)
ANION GAP: 8 meq/L (ref 3–11)
AST: 24 U/L (ref 5–34)
BUN: 16 mg/dL (ref 7.0–26.0)
CO2: 28 meq/L (ref 22–29)
Calcium: 8.9 mg/dL (ref 8.4–10.4)
Chloride: 103 mEq/L (ref 98–109)
Creatinine: 0.8 mg/dL (ref 0.7–1.3)
EGFR: 88 mL/min/{1.73_m2} — AB (ref >90)
Glucose: 71 mg/dl (ref 70–140)
Potassium: 4.3 mEq/L (ref 3.5–5.1)
Sodium: 139 mEq/L (ref 136–145)
Total Bilirubin: 0.57 mg/dL (ref 0.20–1.20)
Total Protein: 6.7 g/dL (ref 6.4–8.3)

## 2017-01-13 LAB — CBC WITH DIFFERENTIAL/PLATELET
BASO%: 0.4 % (ref 0.0–2.0)
Basophils Absolute: 0 10*3/uL (ref 0.0–0.1)
EOS%: 1.1 % (ref 0.0–7.0)
Eosinophils Absolute: 0.1 10*3/uL (ref 0.0–0.5)
HCT: 44.5 % (ref 38.4–49.9)
HGB: 15 g/dL (ref 13.0–17.1)
LYMPH%: 20.3 % (ref 14.0–49.0)
MCH: 30.7 pg (ref 27.2–33.4)
MCHC: 33.7 g/dL (ref 32.0–36.0)
MCV: 91.1 fL (ref 79.3–98.0)
MONO#: 0.6 10*3/uL (ref 0.1–0.9)
MONO%: 10.6 % (ref 0.0–14.0)
NEUT%: 67.6 % (ref 39.0–75.0)
NEUTROS ABS: 4.2 10*3/uL (ref 1.5–6.5)
Platelets: 344 10*3/uL (ref 140–400)
RBC: 4.88 10*6/uL (ref 4.20–5.82)
RDW: 13.1 % (ref 11.0–14.6)
WBC: 6.1 10*3/uL (ref 4.0–10.3)
lymph#: 1.2 10*3/uL (ref 0.9–3.3)

## 2017-01-13 LAB — LACTATE DEHYDROGENASE: LDH: 142 U/L (ref 125–245)

## 2017-01-13 NOTE — Telephone Encounter (Signed)
Appointments scheduled per 3/2 LOS. Patient notified.

## 2017-01-13 NOTE — Telephone Encounter (Signed)
Appointments scheduled per 3/2 LOS.

## 2017-01-13 NOTE — Assessment & Plan Note (Signed)
He has palpable lymphadenopathy on the left side of his neck. It is small but discrete. Given his history of lymphoma, I recommend repeat imaging study for further evaluation with PET CT scan and he agreed to proceed

## 2017-01-13 NOTE — Progress Notes (Signed)
Daniel Vas[facility] OFFICE PROGRESS NOTE  Patient Care Team: Lawerance Cruel, MD as PCP - General (Family Medicine) Ruby Cola, MD as Attending Physician (Otolaryngology) Thea Silversmith, MD (Inactive) (Radiation Oncology) Marylynn Pearson, MD as Attending Physician (Ophthalmology) Leandrew Koyanagi, MD (Urology)  SUMMARY OF ONCOLOGIC HISTORY: Oncology History   Non-Hodgkin lymphoma   Primary site: Lymphoid Neoplasms   Clinical: Stage I signed by Heath Lark, MD on 01/08/2014 10:33 AM   Pathologic: Stage I signed by Heath Lark, MD on 01/08/2014 10:33 AM   Summary: Stage I       History of lymphoma   12/20/2012 Imaging    MRI showed 8 mm right subcutaneous lesion is most compatible with a small benign appearing lymph node.       12/26/2012 Surgery    He underwent excisional lymph node biopsy for palpable mass      12/26/2012 Pathology Results    Soft tissue mass, simple excision, right neck - LYMPH NODAL TISSUE WITH ATYPICAL FOLLICULAR PROLIFERATION SUSPICIOUS FOR NON-HODGKIN'S B-CELL LYMPHOMA, SEE COMMENT. Microscopic Comment Much of the specimen consists of soft tissue including fibroadipose tissue and skeletal muscle. The soft tissue contains limited lymph nodal tissue, the largest fragment of which measures 0.8 cm as estimated from the glass slide. This particular fragment displays a few atypical lymphoid follicles characterized by relatively ill defined mantle zones and a homogeneous composition of medium to large lymphoid cells with vesicular chromatin and prominent nucleoli with lack of obvious polarity or abundant tingible body macrophages. Other fragments of lymphoid tissue in the background contain hyperplastic appearing lymphoid follicles with polarity, well defined mantle zones and tingible body macrophages. Flow cytometric analysis was attempted but was non-contributory likely due to sampling. Immunohistochemical stains for CD3, CD20 and BCL-2 were performed with  appropriate controls. The atypical follicles in question stain positively for CD20 (B-cell marker) as well as BCL-2. B-cells in the follicles are surrounded by abundant T-cells as seen with CD3. 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 Additional tissue may be warranted for confirmation and further evaluation.       01/08/2013 Imaging    PET/CT scan was negative      07/08/2013 Imaging    CT scan was negative      01/07/2014 Imaging    CT scan was negative       INTERVAL HISTORY: Please see below for problem oriented charting. He returns for further follow-up. He denies palpable lymphadenopathy. No recent infection. He denies recent abnormal anorexia, weight loss or night sweats. He follows with his skin doctor carefully and was recent prescribed 5-FU cream for history of skin cancer.  REVIEW OF SYSTEMS:   Constitutional: Denies fevers, chills or abnormal weight loss Eyes: Denies blurriness of vision Ears, nose, mouth, throat, and face: Denies mucositis or sore throat Respiratory: Denies cough, dyspnea or wheezes Cardiovascular: Denies palpitation, chest discomfort or lower extremity swelling Gastrointestinal:  Denies nausea, heartburn or change in bowel habits Skin: Denies abnormal skin rashes Lymphatics: Denies new lymphadenopathy or easy bruising Neurological:Denies numbness, tingling or new weaknesses Behavioral/Psych: Mood is stable, no new changes  All other systems were reviewed with the patient and are negative.  I have reviewed the past medical history, past surgical history, social history and family history with the patient and they are unchanged from previous note.  ALLERGIES:  is allergic to iodinated diagnostic agents.  MEDICATIONS:  Current Outpatient Prescriptions  Medication  Sig Dispense Refill  . aspirin 81 MG tablet Take 81 mg by mouth daily.    .  Cyanocobalamin (VITAMIN B 12 PO) Take by mouth daily.    . Multiple Vitamin (MULTIVITAMIN) tablet Take 1 tablet by mouth daily.    . Olopatadine HCl (PAZEO) 0.7 % SOLN Place 1 drop into both eyes daily as needed (for itchy eyes). 2.5 mL 5  . travoprost, benzalkonium, (TRAVATAN) 0.004 % ophthalmic solution 1 drop at bedtime.    [name] Ketotifen Fumarate (ALAWAY OP) Apply to eye as needed.    . montelukast (SINGULAIR) 10 MG tablet Take 1 tablet (10 mg total) by mouth at bedtime. (Patient not taking: Reported on 01/13/2017) 30 tablet 5   No current facility-administered medications for this visit.     PHYSICAL EXAMINATION: ECOG PERFORMANCE STATUS: 0 - Asymptomatic  Vitals:   01/13/17 0940  BP: 128/72  Pulse: 72  Resp: 18  Temp: 98 F (36.7 C)   Filed Weights   01/13/17 0940  Weight: 178 lb (80.7 kg)    GENERAL:alert, no distress and comfortable SKIN: skin color, texture, turgor are normal, no rashes or significant lesions EYES: normal, Conjunctiva are pink and non-injected, sclera clear OROPHARYNX:no exudate, no erythema and lips, buccal mucosa, and tongue normal  NECK: supple, thyroid normal size, non-tender, without nodularity LYMPH: He is noted to have palpable lymphadenopathy in the left cervical region and a small one in the right inguinal region LUNGS: clear to auscultation and percussion with normal breathing effort HEART: regular rate & rhythm and no murmurs and no lower extremity edema ABDOMEN:abdomen soft, non-tender and normal bowel sounds Musculoskeletal:no cyanosis of digits and no clubbing  NEURO: alert & oriented x 3 with fluent speech, no focal motor/sensory deficits  LABORATORY DATA:  I have reviewed the data as listed    Component Value Date/Time   NA 139 01/13/2017 0927   K 4.3 01/13/2017 0927   CL 103 01/04/2013 1113   CO2 28 01/13/2017 0927   GLUCOSE 71 01/13/2017 0927   GLUCOSE 106 (H) 01/04/2013 1113   BUN 16.0 01/13/2017 0927   CREATININE 0.8 01/13/2017  0927   CALCIUM 8.9 01/13/2017 0927   PROT 6.7 01/13/2017 0927   ALBUMIN 4.0 01/13/2017 0927   AST 24 01/13/2017 0927   ALT 34 01/13/2017 0927   ALKPHOS 97 01/13/2017 0927   BILITOT 0.57 01/13/2017 0927    No results found for: SPEP, UPEP  Lab Results  Component Value Date   WBC 6.1 01/13/2017   NEUTROABS 4.2 01/13/2017   HGB 15.0 01/13/2017   HCT 44.5 01/13/2017   MCV 91.1 01/13/2017   PLT 344 01/13/2017      Chemistry      Component Value Date/Time   NA 139 01/13/2017 0927   K 4.3 01/13/2017 0927   CL 103 01/04/2013 1113   CO2 28 01/13/2017 0927   BUN 16.0 01/13/2017 0927   CREATININE 0.8 01/13/2017 0927      Component Value Date/Time   CALCIUM 8.9 01/13/2017 0927   ALKPHOS 97 01/13/2017 0927   AST 24 01/13/2017 0927   ALT 34 01/13/2017 0927   BILITOT 0.57 01/13/2017 0927       ASSESSMENT & PLAN:  History of lymphoma Today, there is palpable lymphadenopathy in the neck and the right groin area.  It is small. I recommend imaging study for further evaluation. I discussed with him the natural history of low-grade non-Hodgkin B-cell lymphoma. He agreed to proceed with imaging study.  Blood work today is satisfactory.  Skin cancer, basal cell Patient with prior diagnosis of lymphoma is at high risk of developing skin cancer. I reinforced the importance of dermatology review He was recently prescribed 5-FU cream.  Head and neck lymphadenopathy He has palpable lymphadenopathy on the left side of his neck. It is small but discrete. Given his history of lymphoma, I recommend repeat imaging study for further evaluation with PET CT scan and he agreed to proceed   Orders Placed This Encounter  Procedures  . NM PET Image Restag (PS) Skull Base To Thigh    Standing Status:   Future    Standing Expiration Date:   02/17/2018    Order Specific Question:   Reason for exam:    Answer:   staging lymphoma, new palpable lymphadenopathy    Order Specific Question:    Preferred imaging location?    Answer:   [facility]   All questions were answered. The patient knows to call the clinic with any problems, questions or concerns. No barriers to learning was detected. I spent 15 minutes counseling the patient face to face. The total time spent in the appointment was 20 minutes and more than 50% was on counseling and review of test results     Heath Lark, MD 01/13/2017 4:02 PM

## 2017-01-13 NOTE — Assessment & Plan Note (Addendum)
Patient with prior diagnosis of lymphoma is at high risk of developing skin cancer. I reinforced the importance of dermatology review He was recently prescribed 5-FU cream.

## 2017-01-13 NOTE — Assessment & Plan Note (Addendum)
Today, there is palpable lymphadenopathy in the neck and the right groin area.  It is small. I recommend imaging study for further evaluation. I discussed with him the natural history of low-grade non-Hodgkin B-cell lymphoma. He agreed to proceed with imaging study.  Blood work today is satisfactory.

## 2017-01-17 DIAGNOSIS — M65331 Trigger finger, right middle finger: Secondary | ICD-10-CM | POA: Diagnosis not present

## 2017-01-17 DIAGNOSIS — Z4789 Encounter for other orthopedic aftercare: Secondary | ICD-10-CM | POA: Diagnosis not present

## 2017-01-17 DIAGNOSIS — M65341 Trigger finger, right ring finger: Secondary | ICD-10-CM | POA: Diagnosis not present

## 2017-01-26 ENCOUNTER — Ambulatory Visit (HOSPITAL_COMMUNITY)
Admission: RE | Admit: 2017-01-26 | Discharge: 2017-01-26 | Disposition: A | Discharge status: Home / Self Care | Payer: Medicare Other | Source: Ambulatory Visit | Attending: Hematology and Oncology | Admitting: Hematology and Oncology

## 2017-01-26 ENCOUNTER — Telehealth: Payer: Self-pay | Admitting: Hematology and Oncology

## 2017-01-26 ENCOUNTER — Other Ambulatory Visit: Payer: Self-pay | Admitting: Hematology and Oncology

## 2017-01-26 DIAGNOSIS — R590 Localized enlarged lymph nodes: Secondary | ICD-10-CM | POA: Diagnosis not present

## 2017-01-26 DIAGNOSIS — K8020 Calculus of gallbladder without cholecystitis without obstruction: Secondary | ICD-10-CM | POA: Insufficient documentation

## 2017-01-26 DIAGNOSIS — Z8572 Personal history of non-Hodgkin lymphomas: Secondary | ICD-10-CM

## 2017-01-26 DIAGNOSIS — K5730 Diverticulosis of large intestine without perforation or abscess without bleeding: Secondary | ICD-10-CM | POA: Diagnosis not present

## 2017-01-26 DIAGNOSIS — C8590 Non-Hodgkin lymphoma, unspecified, unspecified site: Secondary | ICD-10-CM | POA: Insufficient documentation

## 2017-01-26 DIAGNOSIS — Z8579 Personal history of other malignant neoplasms of lymphoid, hematopoietic and related tissues: Secondary | ICD-10-CM

## 2017-01-26 LAB — GLUCOSE, CAPILLARY: Glucose-Capillary: 107 mg/dL — ABNORMAL HIGH (ref 65–99)

## 2017-01-26 MED ORDER — FLUDEOXYGLUCOSE F - 18 (FDG) INJECTION
8.8700 | Freq: Once | INTRAVENOUS | Status: AC | PRN
  Administered 2017-01-26: 8.87 via INTRAVENOUS

## 2017-01-26 NOTE — Telephone Encounter (Signed)
I reviewed the PET CT scan result with the patient over the telephone. Imaging study is negative.  The patient is reassured. I will consult his return appointment tomorrow and rescheduled to 1 year.  He agreed with the plan of care.

## 2017-01-27 ENCOUNTER — Ambulatory Visit: Payer: Medicare Other | Admitting: Hematology and Oncology

## 2017-01-28 ENCOUNTER — Telehealth: Payer: Self-pay | Admitting: Hematology and Oncology

## 2017-01-28 NOTE — Telephone Encounter (Signed)
Left message for patient re 01/26/18 appointments.

## 2017-04-20 DIAGNOSIS — R300 Dysuria: Secondary | ICD-10-CM | POA: Diagnosis not present

## 2017-04-25 DIAGNOSIS — H6120 Impacted cerumen, unspecified ear: Secondary | ICD-10-CM | POA: Diagnosis not present

## 2017-04-25 DIAGNOSIS — H9193 Unspecified hearing loss, bilateral: Secondary | ICD-10-CM | POA: Diagnosis not present

## 2017-06-13 DIAGNOSIS — H2513 Age-related nuclear cataract, bilateral: Secondary | ICD-10-CM | POA: Diagnosis not present

## 2017-06-13 DIAGNOSIS — H401131 Primary open-angle glaucoma, bilateral, mild stage: Secondary | ICD-10-CM | POA: Diagnosis not present

## 2017-06-13 DIAGNOSIS — H1011 Acute atopic conjunctivitis, right eye: Secondary | ICD-10-CM | POA: Diagnosis not present

## 2017-07-14 DIAGNOSIS — N401 Enlarged prostate with lower urinary tract symptoms: Secondary | ICD-10-CM | POA: Diagnosis not present

## 2017-07-14 DIAGNOSIS — R972 Elevated prostate specific antigen [PSA]: Secondary | ICD-10-CM | POA: Diagnosis not present

## 2017-07-14 DIAGNOSIS — R351 Nocturia: Secondary | ICD-10-CM | POA: Diagnosis not present

## 2017-08-08 DIAGNOSIS — D225 Melanocytic nevi of trunk: Secondary | ICD-10-CM | POA: Diagnosis not present

## 2017-08-08 DIAGNOSIS — L814 Other melanin hyperpigmentation: Secondary | ICD-10-CM | POA: Diagnosis not present

## 2017-08-08 DIAGNOSIS — L821 Other seborrheic keratosis: Secondary | ICD-10-CM | POA: Diagnosis not present

## 2017-08-08 DIAGNOSIS — C44712 Basal cell carcinoma of skin of right lower limb, including hip: Secondary | ICD-10-CM | POA: Diagnosis not present

## 2017-08-08 DIAGNOSIS — D1801 Hemangioma of skin and subcutaneous tissue: Secondary | ICD-10-CM | POA: Diagnosis not present

## 2017-08-08 DIAGNOSIS — L570 Actinic keratosis: Secondary | ICD-10-CM | POA: Diagnosis not present

## 2017-08-08 DIAGNOSIS — Z85828 Personal history of other malignant neoplasm of skin: Secondary | ICD-10-CM | POA: Diagnosis not present

## 2017-08-13 DIAGNOSIS — Z23 Encounter for immunization: Secondary | ICD-10-CM | POA: Diagnosis not present

## 2017-08-23 DIAGNOSIS — Z0000 Encounter for general adult medical examination without abnormal findings: Secondary | ICD-10-CM | POA: Diagnosis not present

## 2017-08-23 DIAGNOSIS — Z136 Encounter for screening for cardiovascular disorders: Secondary | ICD-10-CM | POA: Diagnosis not present

## 2017-08-23 DIAGNOSIS — Z8572 Personal history of non-Hodgkin lymphomas: Secondary | ICD-10-CM | POA: Diagnosis not present

## 2017-08-23 DIAGNOSIS — Z13220 Encounter for screening for lipoid disorders: Secondary | ICD-10-CM | POA: Diagnosis not present

## 2017-08-23 DIAGNOSIS — H409 Unspecified glaucoma: Secondary | ICD-10-CM | POA: Diagnosis not present

## 2017-12-19 DIAGNOSIS — H401131 Primary open-angle glaucoma, bilateral, mild stage: Secondary | ICD-10-CM | POA: Diagnosis not present

## 2017-12-19 DIAGNOSIS — H2513 Age-related nuclear cataract, bilateral: Secondary | ICD-10-CM | POA: Diagnosis not present

## 2017-12-19 DIAGNOSIS — S0511XA Contusion of eyeball and orbital tissues, right eye, initial encounter: Secondary | ICD-10-CM | POA: Diagnosis not present

## 2018-01-05 DIAGNOSIS — H401131 Primary open-angle glaucoma, bilateral, mild stage: Secondary | ICD-10-CM | POA: Diagnosis not present

## 2018-01-05 DIAGNOSIS — S0511XA Contusion of eyeball and orbital tissues, right eye, initial encounter: Secondary | ICD-10-CM | POA: Diagnosis not present

## 2018-01-05 DIAGNOSIS — H2513 Age-related nuclear cataract, bilateral: Secondary | ICD-10-CM | POA: Diagnosis not present

## 2018-01-26 ENCOUNTER — Telehealth: Payer: Self-pay

## 2018-01-26 ENCOUNTER — Encounter: Payer: Self-pay | Admitting: Hematology and Oncology

## 2018-01-26 ENCOUNTER — Inpatient Hospital Stay: Payer: Medicare Other

## 2018-01-26 ENCOUNTER — Telehealth: Payer: Self-pay | Admitting: Hematology and Oncology

## 2018-01-26 ENCOUNTER — Inpatient Hospital Stay: Payer: Medicare Other | Attending: Hematology and Oncology | Admitting: Hematology and Oncology

## 2018-01-26 DIAGNOSIS — Z8572 Personal history of non-Hodgkin lymphomas: Secondary | ICD-10-CM

## 2018-01-26 DIAGNOSIS — Z79899 Other long term (current) drug therapy: Secondary | ICD-10-CM | POA: Diagnosis not present

## 2018-01-26 DIAGNOSIS — C4491 Basal cell carcinoma of skin, unspecified: Secondary | ICD-10-CM | POA: Diagnosis not present

## 2018-01-26 DIAGNOSIS — Z7982 Long term (current) use of aspirin: Secondary | ICD-10-CM | POA: Insufficient documentation

## 2018-01-26 DIAGNOSIS — D72819 Decreased white blood cell count, unspecified: Secondary | ICD-10-CM | POA: Insufficient documentation

## 2018-01-26 LAB — COMPREHENSIVE METABOLIC PANEL
ALT: 33 U/L (ref 0–55)
AST: 28 U/L (ref 5–34)
Albumin: 4 g/dL (ref 3.5–5.0)
Alkaline Phosphatase: 94 U/L (ref 40–150)
Anion gap: 7 (ref 3–11)
BUN: 13 mg/dL (ref 7–26)
CHLORIDE: 105 mmol/L (ref 98–109)
CO2: 28 mmol/L (ref 22–29)
Calcium: 9.3 mg/dL (ref 8.4–10.4)
Creatinine, Ser: 0.83 mg/dL (ref 0.70–1.30)
GFR calc Af Amer: >60 mL/min (ref >60)
Glucose, Bld: 69 mg/dL — ABNORMAL LOW (ref 70–140)
POTASSIUM: 4.2 mmol/L (ref 3.5–5.1)
Sodium: 140 mmol/L (ref 136–145)
Total Bilirubin: 0.7 mg/dL (ref 0.2–1.2)
Total Protein: 7 g/dL (ref 6.4–8.3)

## 2018-01-26 LAB — CBC WITH DIFFERENTIAL/PLATELET
Basophils Absolute: 0 10*3/uL (ref 0.0–0.1)
Basophils Relative: 1 %
EOS PCT: 2 %
Eosinophils Absolute: 0.1 10*3/uL (ref 0.0–0.5)
HCT: 44.6 % (ref 38.4–49.9)
Hemoglobin: 15.3 g/dL (ref 13.0–17.1)
Lymphocytes Relative: 30 %
Lymphs Abs: 1.1 10*3/uL (ref 0.9–3.3)
MCH: 31 pg (ref 27.2–33.4)
MCHC: 34.3 g/dL (ref 32.0–36.0)
MCV: 90.3 fL (ref 79.3–98.0)
MONOS PCT: 15 %
Monocytes Absolute: 0.6 10*3/uL (ref 0.1–0.9)
NEUTROS ABS: 2 10*3/uL (ref 1.5–6.5)
Neutrophils Relative %: 52 %
PLATELETS: 247 10*3/uL (ref 140–400)
RBC: 4.94 MIL/uL (ref 4.20–5.82)
RDW: 13.5 % (ref 11.0–14.6)
WBC: 3.8 10*3/uL — ABNORMAL LOW (ref 4.0–10.3)

## 2018-01-26 LAB — LACTATE DEHYDROGENASE: LDH: 149 U/L (ref 125–245)

## 2018-01-26 NOTE — Telephone Encounter (Signed)
Gave avs and calendar for march 2020 °

## 2018-01-26 NOTE — Progress Notes (Signed)
[place] OFFICE PROGRESS NOTE  Patient Care Team: Lawerance Cruel, MD as PCP - General (Family Medicine) Ruby Cola, MD as Attending Physician (Otolaryngology) Thea Silversmith, MD (Inactive) (Radiation Oncology) Marylynn Pearson, MD as Attending Physician (Ophthalmology) Leandrew Koyanagi., MD (Urology)  ASSESSMENT & PLAN:  History of B-cell lymphoma He has no signs or symptoms of cancer recurrence His last PET scan from March 2018 was negative for cancer I discussed with him the natural history of low-grade non-Hodgkin B-cell lymphoma. Blood work today is satisfactory. I will continue to see him once a year with history, physical examination and blood work  Skin cancer, basal cell The patient is at high risks of skin cancer We discussed the importance of skin protection and regular skin exam by dermatologist  Leukopenia The cause is unknown.  He is not symptomatic.  Observe only.   No orders of the defined types were placed in this encounter.   INTERVAL HISTORY: Please see below for problem oriented charting. He returns for further follow-up He has no new lymphadenopathy No recent infection He continues to follow closely with dermatologist He has not needed recurrent treatment for skin cancer  SUMMARY OF ONCOLOGIC HISTORY: Oncology History   Non-Hodgkin lymphoma   Primary site: Lymphoid Neoplasms   Clinical: Stage I signed by Heath Lark, MD on 01/08/2014 10:33 AM   Pathologic: Stage I signed by Heath Lark, MD on 01/08/2014 10:33 AM   Summary: Stage I       History of lymphoma   12/20/2012 Imaging    MRI showed 8 mm right subcutaneous lesion is most compatible with a small benign appearing lymph node.       12/26/2012 Surgery    He underwent excisional lymph node biopsy for palpable mass      12/26/2012 Pathology Results    Soft tissue mass, simple excision, right neck - LYMPH NODAL TISSUE WITH ATYPICAL FOLLICULAR PROLIFERATION SUSPICIOUS  FOR NON-HODGKIN'S B-CELL LYMPHOMA, SEE COMMENT. Microscopic Comment Much of the specimen consists of soft tissue including fibroadipose tissue and skeletal muscle. The soft tissue contains limited lymph nodal tissue, the largest fragment of which measures 0.8 cm as estimated from the glass slide. This particular fragment displays a few atypical lymphoid follicles characterized by relatively ill defined mantle zones and a homogeneous composition of medium to large lymphoid cells with vesicular chromatin and prominent nucleoli with lack of obvious polarity or abundant tingible body macrophages. Other fragments of lymphoid tissue in the background contain hyperplastic appearing lymphoid follicles with polarity, well defined mantle zones and tingible body macrophages. Flow cytometric analysis was attempted but was non-contributory likely due to sampling. Immunohistochemical stains for CD3, CD20 and BCL-2 were performed with appropriate controls. The atypical follicles in question stain positively for CD20 (B-cell marker) as well as BCL-2. B-cells in the follicles are surrounded by abundant T-cells as seen with CD3. 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 Additional tissue may be warranted for confirmation and further evaluation.       01/08/2013 Imaging    PET/CT scan was negative      07/08/2013 Imaging    CT scan was negative      01/07/2014 Imaging    CT scan was negative      01/26/2017 PET scan    No adenopathy or significant abnormal hypermetabolic activity to suggest current active lymphoma. Other imaging findings of potential clinical significance: Cholelithiasis. Descending and sigmoid  colon diverticulosis.        REVIEW OF SYSTEMS:   Constitutional: Denies fevers, chills or abnormal weight loss Eyes: Denies blurriness of vision Ears, nose, mouth, throat, and face: Denies mucositis or sore  throat Respiratory: Denies cough, dyspnea or wheezes Cardiovascular: Denies palpitation, chest discomfort or lower extremity swelling Gastrointestinal:  Denies nausea, heartburn or change in bowel habits Skin: Denies abnormal skin rashes Lymphatics: Denies new lymphadenopathy or easy bruising Neurological:Denies numbness, tingling or new weaknesses Behavioral/Psych: Mood is stable, no new changes  All other systems were reviewed with the patient and are negative.  I have reviewed the past medical history, past surgical history, social history and family history with the patient and they are unchanged from previous note.  ALLERGIES:  is allergic to iodinated diagnostic agents.  MEDICATIONS:  Current Outpatient Medications  Medication Sig Dispense Refill  . aspirin 81 MG tablet Take 81 mg by mouth daily.    . cholecalciferol (VITAMIN D) 1000 units tablet Take 2,000 Units by mouth daily.    [name] Ketotifen Fumarate (ALAWAY OP) Apply to eye as needed.    . Multiple Vitamin (MULTIVITAMIN) tablet Take 1 tablet by mouth daily.    . Olopatadine HCl (PAZEO) 0.7 % SOLN Place 1 drop into both eyes daily as needed (for itchy eyes). 2.5 mL 5  . PRESCRIPTION MEDICATION      No current facility-administered medications for this visit.     PHYSICAL EXAMINATION: ECOG PERFORMANCE STATUS: 1 - Symptomatic but completely ambulatory  Vitals:   01/26/18 0850  BP: 125/76  Pulse: 65  Resp: 18  Temp: 97.9 F (36.6 C)  SpO2: 99%   Filed Weights   01/26/18 0850  Weight: 174 lb 6.4 oz (79.1 kg)    GENERAL:alert, no distress and comfortable SKIN: skin color, texture, turgor are normal, no rashes or significant lesions EYES: normal, Conjunctiva are pink and non-injected, sclera clear OROPHARYNX:no exudate, no erythema and lips, buccal mucosa, and tongue normal  NECK: supple, thyroid normal size, non-tender, without nodularity LYMPH:  no palpable lymphadenopathy in the cervical, axillary or  inguinal LUNGS: clear to auscultation and percussion with normal breathing effort HEART: regular rate & rhythm and no murmurs and no lower extremity edema ABDOMEN:abdomen soft, non-tender and normal bowel sounds Musculoskeletal:no cyanosis of digits and no clubbing  NEURO: alert & oriented x 3 with fluent speech, no focal motor/sensory deficits  LABORATORY DATA:  I have reviewed the data as listed    Component Value Date/Time   NA 140 01/26/2018 0831   NA 139 01/13/2017 0927   K 4.2 01/26/2018 0831   K 4.3 01/13/2017 0927   CL 105 01/26/2018 0831   CL 103 01/04/2013 1113   CO2 28 01/26/2018 0831   CO2 28 01/13/2017 0927   GLUCOSE 69 (L) 01/26/2018 0831   GLUCOSE 71 01/13/2017 0927   GLUCOSE 106 (H) 01/04/2013 1113   BUN 13 01/26/2018 0831   BUN 16.0 01/13/2017 0927   CREATININE 0.83 01/26/2018 0831   CREATININE 0.8 01/13/2017 0927   CALCIUM 9.3 01/26/2018 0831   CALCIUM 8.9 01/13/2017 0927   PROT 7.0 01/26/2018 0831   PROT 6.7 01/13/2017 0927   ALBUMIN 4.0 01/26/2018 0831   ALBUMIN 4.0 01/13/2017 0927   AST 28 01/26/2018 0831   AST 24 01/13/2017 0927   ALT 33 01/26/2018 0831   ALT 34 01/13/2017 0927   ALKPHOS 94 01/26/2018 0831   ALKPHOS 97 01/13/2017 0927   BILITOT 0.7 01/26/2018 0831   BILITOT  0.57 01/13/2017 0927   GFRNONAA >60 01/26/2018 0831   GFRAA >60 01/26/2018 0831    No results found for: SPEP, UPEP  Lab Results  Component Value Date   WBC 3.8 (L) 01/26/2018   NEUTROABS 2.0 01/26/2018   HGB 15.3 01/26/2018   HCT 44.6 01/26/2018   MCV 90.3 01/26/2018   PLT 247 01/26/2018      Chemistry      Component Value Date/Time   NA 140 01/26/2018 0831   NA 139 01/13/2017 0927   K 4.2 01/26/2018 0831   K 4.3 01/13/2017 0927   CL 105 01/26/2018 0831   CL 103 01/04/2013 1113   CO2 28 01/26/2018 0831   CO2 28 01/13/2017 0927   BUN 13 01/26/2018 0831   BUN 16.0 01/13/2017 0927   CREATININE 0.83 01/26/2018 0831   CREATININE 0.8 01/13/2017 0927       Component Value Date/Time   CALCIUM 9.3 01/26/2018 0831   CALCIUM 8.9 01/13/2017 0927   ALKPHOS 94 01/26/2018 0831   ALKPHOS 97 01/13/2017 0927   AST 28 01/26/2018 0831   AST 24 01/13/2017 0927   ALT 33 01/26/2018 0831   ALT 34 01/13/2017 0927   BILITOT 0.7 01/26/2018 0831   BILITOT 0.57 01/13/2017 0927      All questions were answered. The patient knows to call the clinic with any problems, questions or concerns. No barriers to learning was detected.  I spent 10 minutes counseling the patient face to face. The total time spent in the appointment was 15 minutes and more than 50% was on counseling and review of test results  Heath Lark, MD 01/26/2018 1:25 PM

## 2018-01-26 NOTE — Assessment & Plan Note (Signed)
He has no signs or symptoms of cancer recurrence His last PET scan from March 2018 was negative for cancer I discussed with him the natural history of low-grade non-Hodgkin B-cell lymphoma. Blood work today is satisfactory. I will continue to see him once a year with history, physical examination and blood work

## 2018-01-26 NOTE — Telephone Encounter (Signed)
Called with below message. Verbalized understanding. States that he feels fine.

## 2018-01-26 NOTE — Telephone Encounter (Signed)
-----   Message from Heath Lark, MD sent at 01/26/2018 10:33 AM EDT ----- Regarding: low blood glucose PLs let him know blood glucose is low but if he feels well don't worry about it ----- Message ----- From: Buel Ream, Lab In [place] Sent: 01/26/2018   8:42 AM To: Heath Lark, MD

## 2018-01-26 NOTE — Assessment & Plan Note (Signed)
The patient is at high risks of skin cancer We discussed the importance of skin protection and regular skin exam by dermatologist 

## 2018-01-26 NOTE — Assessment & Plan Note (Signed)
The cause is unknown.  He is not symptomatic.  Observe only.

## 2018-01-30 DIAGNOSIS — H401231 Low-tension glaucoma, bilateral, mild stage: Secondary | ICD-10-CM | POA: Diagnosis not present

## 2018-02-01 DIAGNOSIS — Z8572 Personal history of non-Hodgkin lymphomas: Secondary | ICD-10-CM | POA: Diagnosis not present

## 2018-02-01 DIAGNOSIS — R202 Paresthesia of skin: Secondary | ICD-10-CM | POA: Diagnosis not present

## 2018-02-01 DIAGNOSIS — L989 Disorder of the skin and subcutaneous tissue, unspecified: Secondary | ICD-10-CM | POA: Diagnosis not present

## 2018-03-06 DIAGNOSIS — R209 Unspecified disturbances of skin sensation: Secondary | ICD-10-CM | POA: Diagnosis not present

## 2018-04-17 DIAGNOSIS — G5603 Carpal tunnel syndrome, bilateral upper limbs: Secondary | ICD-10-CM | POA: Diagnosis not present

## 2018-04-30 DIAGNOSIS — G5601 Carpal tunnel syndrome, right upper limb: Secondary | ICD-10-CM | POA: Insufficient documentation

## 2018-04-30 DIAGNOSIS — G5602 Carpal tunnel syndrome, left upper limb: Secondary | ICD-10-CM | POA: Insufficient documentation

## 2018-05-08 DIAGNOSIS — G5601 Carpal tunnel syndrome, right upper limb: Secondary | ICD-10-CM | POA: Diagnosis not present

## 2018-05-08 DIAGNOSIS — M65342 Trigger finger, left ring finger: Secondary | ICD-10-CM | POA: Insufficient documentation

## 2018-06-19 DIAGNOSIS — G5601 Carpal tunnel syndrome, right upper limb: Secondary | ICD-10-CM | POA: Diagnosis not present

## 2018-06-19 DIAGNOSIS — G5602 Carpal tunnel syndrome, left upper limb: Secondary | ICD-10-CM | POA: Diagnosis not present

## 2018-06-19 DIAGNOSIS — M65342 Trigger finger, left ring finger: Secondary | ICD-10-CM | POA: Diagnosis not present

## 2018-07-17 DIAGNOSIS — H401231 Low-tension glaucoma, bilateral, mild stage: Secondary | ICD-10-CM | POA: Diagnosis not present

## 2018-07-19 DIAGNOSIS — G5602 Carpal tunnel syndrome, left upper limb: Secondary | ICD-10-CM | POA: Diagnosis not present

## 2018-07-19 DIAGNOSIS — G5601 Carpal tunnel syndrome, right upper limb: Secondary | ICD-10-CM | POA: Diagnosis not present

## 2018-07-19 DIAGNOSIS — M65342 Trigger finger, left ring finger: Secondary | ICD-10-CM | POA: Diagnosis not present

## 2018-08-06 DIAGNOSIS — R972 Elevated prostate specific antigen [PSA]: Secondary | ICD-10-CM | POA: Diagnosis not present

## 2018-08-06 DIAGNOSIS — N401 Enlarged prostate with lower urinary tract symptoms: Secondary | ICD-10-CM | POA: Diagnosis not present

## 2018-08-06 DIAGNOSIS — R351 Nocturia: Secondary | ICD-10-CM | POA: Diagnosis not present

## 2018-08-07 ENCOUNTER — Inpatient Hospital Stay: Payer: Medicare Other | Admitting: Hematology and Oncology

## 2018-08-15 DIAGNOSIS — Z85828 Personal history of other malignant neoplasm of skin: Secondary | ICD-10-CM | POA: Diagnosis not present

## 2018-08-15 DIAGNOSIS — L821 Other seborrheic keratosis: Secondary | ICD-10-CM | POA: Diagnosis not present

## 2018-08-15 DIAGNOSIS — L570 Actinic keratosis: Secondary | ICD-10-CM | POA: Diagnosis not present

## 2018-08-15 DIAGNOSIS — D1801 Hemangioma of skin and subcutaneous tissue: Secondary | ICD-10-CM | POA: Diagnosis not present

## 2018-08-15 DIAGNOSIS — B078 Other viral warts: Secondary | ICD-10-CM | POA: Diagnosis not present

## 2018-08-15 DIAGNOSIS — D225 Melanocytic nevi of trunk: Secondary | ICD-10-CM | POA: Diagnosis not present

## 2018-08-15 DIAGNOSIS — C44719 Basal cell carcinoma of skin of left lower limb, including hip: Secondary | ICD-10-CM | POA: Diagnosis not present

## 2018-08-15 DIAGNOSIS — L814 Other melanin hyperpigmentation: Secondary | ICD-10-CM | POA: Diagnosis not present

## 2018-09-12 DIAGNOSIS — Z0000 Encounter for general adult medical examination without abnormal findings: Secondary | ICD-10-CM | POA: Diagnosis not present

## 2018-09-12 DIAGNOSIS — Z136 Encounter for screening for cardiovascular disorders: Secondary | ICD-10-CM | POA: Diagnosis not present

## 2018-09-12 DIAGNOSIS — Z125 Encounter for screening for malignant neoplasm of prostate: Secondary | ICD-10-CM | POA: Diagnosis not present

## 2018-09-12 DIAGNOSIS — Z1211 Encounter for screening for malignant neoplasm of colon: Secondary | ICD-10-CM | POA: Diagnosis not present

## 2018-09-12 DIAGNOSIS — Z8572 Personal history of non-Hodgkin lymphomas: Secondary | ICD-10-CM | POA: Diagnosis not present

## 2018-09-12 DIAGNOSIS — Z13220 Encounter for screening for lipoid disorders: Secondary | ICD-10-CM | POA: Diagnosis not present

## 2018-09-25 DIAGNOSIS — Z23 Encounter for immunization: Secondary | ICD-10-CM | POA: Diagnosis not present

## 2018-10-25 DIAGNOSIS — M65342 Trigger finger, left ring finger: Secondary | ICD-10-CM | POA: Diagnosis not present

## 2018-10-25 DIAGNOSIS — G5602 Carpal tunnel syndrome, left upper limb: Secondary | ICD-10-CM | POA: Diagnosis not present

## 2018-10-25 DIAGNOSIS — G5601 Carpal tunnel syndrome, right upper limb: Secondary | ICD-10-CM | POA: Diagnosis not present

## 2018-11-26 DIAGNOSIS — K640 First degree hemorrhoids: Secondary | ICD-10-CM | POA: Diagnosis not present

## 2018-11-26 DIAGNOSIS — D122 Benign neoplasm of ascending colon: Secondary | ICD-10-CM | POA: Diagnosis not present

## 2018-11-26 DIAGNOSIS — D127 Benign neoplasm of rectosigmoid junction: Secondary | ICD-10-CM | POA: Diagnosis not present

## 2018-11-26 DIAGNOSIS — D124 Benign neoplasm of descending colon: Secondary | ICD-10-CM | POA: Diagnosis not present

## 2018-11-26 DIAGNOSIS — K5730 Diverticulosis of large intestine without perforation or abscess without bleeding: Secondary | ICD-10-CM | POA: Diagnosis not present

## 2018-11-26 DIAGNOSIS — Z1211 Encounter for screening for malignant neoplasm of colon: Secondary | ICD-10-CM | POA: Diagnosis not present

## 2018-11-28 DIAGNOSIS — D127 Benign neoplasm of rectosigmoid junction: Secondary | ICD-10-CM | POA: Diagnosis not present

## 2018-11-28 DIAGNOSIS — D124 Benign neoplasm of descending colon: Secondary | ICD-10-CM | POA: Diagnosis not present

## 2018-11-28 DIAGNOSIS — Z1211 Encounter for screening for malignant neoplasm of colon: Secondary | ICD-10-CM | POA: Diagnosis not present

## 2018-11-28 DIAGNOSIS — D122 Benign neoplasm of ascending colon: Secondary | ICD-10-CM | POA: Diagnosis not present

## 2018-12-31 DIAGNOSIS — H401231 Low-tension glaucoma, bilateral, mild stage: Secondary | ICD-10-CM | POA: Diagnosis not present

## 2019-01-10 DIAGNOSIS — M65312 Trigger thumb, left thumb: Secondary | ICD-10-CM | POA: Insufficient documentation

## 2019-01-10 DIAGNOSIS — M65342 Trigger finger, left ring finger: Secondary | ICD-10-CM | POA: Diagnosis not present

## 2019-01-10 DIAGNOSIS — G5601 Carpal tunnel syndrome, right upper limb: Secondary | ICD-10-CM | POA: Diagnosis not present

## 2019-01-10 DIAGNOSIS — G5602 Carpal tunnel syndrome, left upper limb: Secondary | ICD-10-CM | POA: Diagnosis not present

## 2019-01-22 ENCOUNTER — Other Ambulatory Visit: Payer: Self-pay | Admitting: Hematology and Oncology

## 2019-01-22 DIAGNOSIS — Z8572 Personal history of non-Hodgkin lymphomas: Secondary | ICD-10-CM

## 2019-01-28 ENCOUNTER — Telehealth: Payer: Self-pay | Admitting: Hematology and Oncology

## 2019-01-28 ENCOUNTER — Encounter: Payer: Self-pay | Admitting: Hematology and Oncology

## 2019-01-28 ENCOUNTER — Inpatient Hospital Stay: Payer: Medicare Other | Attending: Hematology and Oncology | Admitting: Hematology and Oncology

## 2019-01-28 ENCOUNTER — Inpatient Hospital Stay: Payer: Medicare Other

## 2019-01-28 ENCOUNTER — Other Ambulatory Visit: Payer: Self-pay

## 2019-01-28 DIAGNOSIS — Z79899 Other long term (current) drug therapy: Secondary | ICD-10-CM

## 2019-01-28 DIAGNOSIS — Z7982 Long term (current) use of aspirin: Secondary | ICD-10-CM

## 2019-01-28 DIAGNOSIS — C4491 Basal cell carcinoma of skin, unspecified: Secondary | ICD-10-CM

## 2019-01-28 DIAGNOSIS — Z8572 Personal history of non-Hodgkin lymphomas: Secondary | ICD-10-CM | POA: Diagnosis not present

## 2019-01-28 LAB — COMPREHENSIVE METABOLIC PANEL
ALT: 32 U/L (ref 0–44)
ANION GAP: 12 (ref 5–15)
AST: 26 U/L (ref 15–41)
Albumin: 4 g/dL (ref 3.5–5.0)
Alkaline Phosphatase: 101 U/L (ref 38–126)
BUN: 15 mg/dL (ref 8–23)
CALCIUM: 8.9 mg/dL (ref 8.9–10.3)
CO2: 23 mmol/L (ref 22–32)
CREATININE: 0.82 mg/dL (ref 0.61–1.24)
Chloride: 106 mmol/L (ref 98–111)
GFR calc non Af Amer: >60 mL/min (ref >60)
GLUCOSE: 93 mg/dL (ref 70–99)
Potassium: 4.3 mmol/L (ref 3.5–5.1)
SODIUM: 141 mmol/L (ref 135–145)
TOTAL PROTEIN: 6.9 g/dL (ref 6.5–8.1)
Total Bilirubin: 0.6 mg/dL (ref 0.3–1.2)

## 2019-01-28 LAB — CBC WITH DIFFERENTIAL/PLATELET
Abs Immature Granulocytes: 0.01 10*3/uL (ref 0.00–0.07)
Basophils Absolute: 0 10*3/uL (ref 0.0–0.1)
Basophils Relative: 1 %
EOS ABS: 0.1 10*3/uL (ref 0.0–0.5)
Eosinophils Relative: 2 %
HEMATOCRIT: 46.3 % (ref 39.0–52.0)
Hemoglobin: 15.5 g/dL (ref 13.0–17.0)
IMMATURE GRANULOCYTES: 0 %
LYMPHS ABS: 1.3 10*3/uL (ref 0.7–4.0)
Lymphocytes Relative: 26 %
MCH: 30.5 pg (ref 26.0–34.0)
MCHC: 33.5 g/dL (ref 30.0–36.0)
MCV: 91 fL (ref 80.0–100.0)
MONO ABS: 0.7 10*3/uL (ref 0.1–1.0)
MONOS PCT: 13 %
NEUTROS PCT: 58 %
Neutro Abs: 3 10*3/uL (ref 1.7–7.7)
Platelets: 286 10*3/uL (ref 150–400)
RBC: 5.09 MIL/uL (ref 4.22–5.81)
RDW: 12.7 % (ref 11.5–15.5)
WBC: 5.1 10*3/uL (ref 4.0–10.5)
nRBC: 0 % (ref 0.0–0.2)

## 2019-01-28 NOTE — Assessment & Plan Note (Signed)
The patient is at high risks of skin cancer We discussed the importance of skin protection and regular skin exam by dermatologist

## 2019-01-28 NOTE — Progress Notes (Signed)
[facility] PROGRESS NOTE  Patient Care Team: Daniel Cruel, MD as PCP - General (Family Medicine) Daniel Cola, MD as Attending Physician (Otolaryngology) Daniel Silversmith, MD (Radiation Oncology) Daniel Pearson, MD as Attending Physician (Ophthalmology) Daniel Vasquez., MD (Urology)  ASSESSMENT & PLAN:  History of B-cell lymphoma He has no signs or symptoms of cancer recurrence His last PET scan from March 2018 was negative for cancer I discussed with him the natural history of low-grade non-Hodgkin B-cell lymphoma. Blood work today and examination are satisfactory. I will continue to see him once a year with history, physical examination and blood work  Skin cancer, basal cell The patient is at high risks of skin cancer We discussed the importance of skin protection and regular skin exam by dermatologist   Orders Placed This Encounter  Procedures  . CBC with Differential/Platelet    Standing Status:   Future    Standing Expiration Date:   03/03/2020  . Comprehensive metabolic panel    Standing Status:   Future    Standing Expiration Date:   03/03/2020    INTERVAL HISTORY: Please see below for problem oriented charting. He returns for his yearly follow-up He denies new lymphadenopathy Denies abnormal anorexia, weight loss or night sweats No recent infection, fever or chills He follows with dermatologist on a regular basis  SUMMARY OF ONCOLOGIC HISTORY: Oncology History   Non-Hodgkin lymphoma   Primary site: Lymphoid Neoplasms   Clinical: Stage I signed by Daniel Lark, MD on 01/08/2014 10:33 AM   Pathologic: Stage I signed by Daniel Lark, MD on 01/08/2014 10:33 AM   Summary: Stage I       History of lymphoma   12/20/2012 Imaging    MRI showed 8 mm right subcutaneous lesion is most compatible with a small benign appearing lymph node.     12/26/2012 Surgery    He underwent excisional lymph node biopsy for palpable mass    12/26/2012  Pathology Results    Soft tissue mass, simple excision, right neck - LYMPH NODAL TISSUE WITH ATYPICAL FOLLICULAR PROLIFERATION SUSPICIOUS FOR NON-HODGKIN'S B-CELL LYMPHOMA, SEE COMMENT. Microscopic Comment Much of the specimen consists of soft tissue including fibroadipose tissue and skeletal muscle. The soft tissue contains limited lymph nodal tissue, the largest fragment of which measures 0.8 cm as estimated from the glass slide. This particular fragment displays a few atypical lymphoid follicles characterized by relatively ill defined mantle zones and a homogeneous composition of medium to large lymphoid cells with vesicular chromatin and prominent nucleoli with lack of obvious polarity or abundant tingible body macrophages. Other fragments of lymphoid tissue in the background contain hyperplastic appearing lymphoid follicles with polarity, well defined mantle zones and tingible body macrophages. Flow cytometric analysis was attempted but was non-contributory likely due to sampling. Immunohistochemical stains for CD3, CD20 and BCL-2 were performed with appropriate controls. The atypical follicles in question stain positively for CD20 (B-cell marker) as well as BCL-2. B-cells in the follicles are surrounded by abundant T-cells as seen with CD3. 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 Additional tissue may be warranted for confirmation and further evaluation.     01/08/2013 Imaging    PET/CT scan was negative    07/08/2013 Imaging    CT scan was negative    01/07/2014 Imaging    CT scan was negative    01/26/2017 PET scan    No adenopathy or significant abnormal  hypermetabolic activity to suggest current active lymphoma. Other imaging findings of potential clinical significance: Cholelithiasis. Descending and sigmoid colon diverticulosis.      REVIEW OF SYSTEMS:   Constitutional: Denies fevers,  chills or abnormal weight loss Eyes: Denies blurriness of vision Ears, nose, mouth, throat, and face: Denies mucositis or sore throat Respiratory: Denies cough, dyspnea or wheezes Cardiovascular: Denies palpitation, chest discomfort or lower extremity swelling Gastrointestinal:  Denies nausea, heartburn or change in bowel habits Skin: Denies abnormal skin rashes Lymphatics: Denies new lymphadenopathy or easy bruising Neurological:Denies numbness, tingling or new weaknesses Behavioral/Psych: Mood is stable, no new changes  All other systems were reviewed with the patient and are negative.  I have reviewed the past medical history, past surgical history, social history and family history with the patient and they are unchanged from previous note.  ALLERGIES:  is allergic to iodinated diagnostic agents.  MEDICATIONS:  Current Outpatient Medications  Medication Sig Dispense Refill  . aspirin 81 MG tablet Take 81 mg by mouth 3 (three) times a week.    [name] VYZULTA 0.024 % SOLN Apply 1 drop topically daily.    . cholecalciferol (VITAMIN D) 1000 units tablet Take 2,000 Units by mouth daily.    . Multiple Vitamin (MULTIVITAMIN) tablet Take 1 tablet by mouth daily.     No current facility-administered medications for this visit.     PHYSICAL EXAMINATION: ECOG PERFORMANCE STATUS: 0 - Asymptomatic  Vitals:   01/28/19 0823  BP: 124/76  Pulse: 70  Resp: 18  Temp: 98 F (36.7 C)  SpO2: 96%   Filed Weights   01/28/19 0823  Weight: 172 lb (78 kg)    GENERAL:alert, no distress and comfortable SKIN: skin color, texture, turgor are normal, no rashes or significant lesions EYES: normal, Conjunctiva are pink and non-injected, sclera clear OROPHARYNX:no exudate, no erythema and lips, buccal mucosa, and tongue normal  NECK: supple, thyroid normal size, non-tender, without nodularity LYMPH:  no palpable lymphadenopathy in the cervical, axillary or inguinal LUNGS: clear to auscultation and  percussion with normal breathing effort HEART: regular rate & rhythm and no murmurs and no lower extremity edema ABDOMEN:abdomen soft, non-tender and normal bowel sounds Musculoskeletal:no cyanosis of digits and no clubbing  NEURO: alert & oriented x 3 with fluent speech, no focal motor/sensory deficits  LABORATORY DATA:  I have reviewed the data as listed    Component Value Date/Time   NA 141 01/28/2019 0748   NA 139 01/13/2017 0927   K 4.3 01/28/2019 0748   K 4.3 01/13/2017 0927   CL 106 01/28/2019 0748   CL 103 01/04/2013 1113   CO2 23 01/28/2019 0748   CO2 28 01/13/2017 0927   GLUCOSE 93 01/28/2019 0748   GLUCOSE 71 01/13/2017 0927   GLUCOSE 106 (H) 01/04/2013 1113   BUN 15 01/28/2019 0748   BUN 16.0 01/13/2017 0927   CREATININE 0.82 01/28/2019 0748   CREATININE 0.8 01/13/2017 0927   CALCIUM 8.9 01/28/2019 0748   CALCIUM 8.9 01/13/2017 0927   PROT 6.9 01/28/2019 0748   PROT 6.7 01/13/2017 0927   ALBUMIN 4.0 01/28/2019 0748   ALBUMIN 4.0 01/13/2017 0927   AST 26 01/28/2019 0748   AST 24 01/13/2017 0927   ALT 32 01/28/2019 0748   ALT 34 01/13/2017 0927   ALKPHOS 101 01/28/2019 0748   ALKPHOS 97 01/13/2017 0927   BILITOT 0.6 01/28/2019 0748   BILITOT 0.57 01/13/2017 0927   GFRNONAA >60 01/28/2019 0748   GFRAA >60 01/28/2019 2778  No results found for: SPEP, UPEP  Lab Results  Component Value Date   WBC 5.1 01/28/2019   NEUTROABS 3.0 01/28/2019   HGB 15.5 01/28/2019   HCT 46.3 01/28/2019   MCV 91.0 01/28/2019   PLT 286 01/28/2019      Chemistry      Component Value Date/Time   NA 141 01/28/2019 0748   NA 139 01/13/2017 0927   K 4.3 01/28/2019 0748   K 4.3 01/13/2017 0927   CL 106 01/28/2019 0748   CL 103 01/04/2013 1113   CO2 23 01/28/2019 0748   CO2 28 01/13/2017 0927   BUN 15 01/28/2019 0748   BUN 16.0 01/13/2017 0927   CREATININE 0.82 01/28/2019 0748   CREATININE 0.8 01/13/2017 0927      Component Value Date/Time   CALCIUM 8.9 01/28/2019  0748   CALCIUM 8.9 01/13/2017 0927   ALKPHOS 101 01/28/2019 0748   ALKPHOS 97 01/13/2017 0927   AST 26 01/28/2019 0748   AST 24 01/13/2017 0927   ALT 32 01/28/2019 0748   ALT 34 01/13/2017 0927   BILITOT 0.6 01/28/2019 0748   BILITOT 0.57 01/13/2017 0927     All questions were answered. The patient knows to call the clinic with any problems, questions or concerns. No barriers to learning was detected.  I spent 10 minutes counseling the patient face to face. The total time spent in the appointment was 15 minutes and more than 50% was on counseling and review of test results  Daniel Lark, MD 01/28/2019 2:27 PM

## 2019-01-28 NOTE — Assessment & Plan Note (Signed)
He has no signs or symptoms of cancer recurrence His last PET scan from March 2018 was negative for cancer I discussed with him the natural history of low-grade non-Hodgkin B-cell lymphoma. Blood work today and examination are satisfactory. I will continue to see him once a year with history, physical examination and blood work

## 2019-01-28 NOTE — Telephone Encounter (Signed)
Gave avs and calendar ° °

## 2019-03-20 DIAGNOSIS — J3489 Other specified disorders of nose and nasal sinuses: Secondary | ICD-10-CM | POA: Diagnosis not present

## 2019-05-28 DIAGNOSIS — H401231 Low-tension glaucoma, bilateral, mild stage: Secondary | ICD-10-CM | POA: Diagnosis not present

## 2019-07-05 DIAGNOSIS — M545 Low back pain: Secondary | ICD-10-CM | POA: Diagnosis not present

## 2019-07-05 DIAGNOSIS — M5136 Other intervertebral disc degeneration, lumbar region: Secondary | ICD-10-CM | POA: Diagnosis not present

## 2019-07-29 DIAGNOSIS — M545 Low back pain: Secondary | ICD-10-CM | POA: Diagnosis not present

## 2019-08-07 DIAGNOSIS — N509 Disorder of male genital organs, unspecified: Secondary | ICD-10-CM | POA: Diagnosis not present

## 2019-08-07 DIAGNOSIS — R972 Elevated prostate specific antigen [PSA]: Secondary | ICD-10-CM | POA: Diagnosis not present

## 2019-08-08 DIAGNOSIS — Z23 Encounter for immunization: Secondary | ICD-10-CM | POA: Diagnosis not present

## 2019-08-13 DIAGNOSIS — R10814 Left lower quadrant abdominal tenderness: Secondary | ICD-10-CM | POA: Diagnosis not present

## 2019-09-03 DIAGNOSIS — L821 Other seborrheic keratosis: Secondary | ICD-10-CM | POA: Diagnosis not present

## 2019-09-03 DIAGNOSIS — Z85828 Personal history of other malignant neoplasm of skin: Secondary | ICD-10-CM | POA: Diagnosis not present

## 2019-09-03 DIAGNOSIS — D225 Melanocytic nevi of trunk: Secondary | ICD-10-CM | POA: Diagnosis not present

## 2019-09-03 DIAGNOSIS — L814 Other melanin hyperpigmentation: Secondary | ICD-10-CM | POA: Diagnosis not present

## 2019-09-03 DIAGNOSIS — D1801 Hemangioma of skin and subcutaneous tissue: Secondary | ICD-10-CM | POA: Diagnosis not present

## 2019-09-03 DIAGNOSIS — L570 Actinic keratosis: Secondary | ICD-10-CM | POA: Diagnosis not present

## 2019-10-14 DIAGNOSIS — Z8572 Personal history of non-Hodgkin lymphomas: Secondary | ICD-10-CM | POA: Diagnosis not present

## 2019-10-14 DIAGNOSIS — H409 Unspecified glaucoma: Secondary | ICD-10-CM | POA: Diagnosis not present

## 2019-10-14 DIAGNOSIS — Z131 Encounter for screening for diabetes mellitus: Secondary | ICD-10-CM | POA: Diagnosis not present

## 2019-10-14 DIAGNOSIS — Z13220 Encounter for screening for lipoid disorders: Secondary | ICD-10-CM | POA: Diagnosis not present

## 2019-10-14 DIAGNOSIS — Z0000 Encounter for general adult medical examination without abnormal findings: Secondary | ICD-10-CM | POA: Diagnosis not present

## 2019-10-14 DIAGNOSIS — K4090 Unilateral inguinal hernia, without obstruction or gangrene, not specified as recurrent: Secondary | ICD-10-CM | POA: Diagnosis not present

## 2019-10-22 DIAGNOSIS — H401231 Low-tension glaucoma, bilateral, mild stage: Secondary | ICD-10-CM | POA: Diagnosis not present

## 2019-11-29 ENCOUNTER — Ambulatory Visit: Payer: Medicare Other | Attending: Internal Medicine

## 2019-11-29 DIAGNOSIS — Z23 Encounter for immunization: Secondary | ICD-10-CM

## 2019-11-29 NOTE — Progress Notes (Signed)
   Covid-19 Vaccination Clinic  Name:  [facility]    MRN: [id number] DOB: 1941-06-04  11/29/2019  Mr. Martensen was observed post Covid-19 immunization for 15 minutes without incidence. He was provided with Vaccine Information Sheet and instruction to access the V-Safe system.   Mr. Harbottle was instructed to call 911 with any severe reactions post vaccine: [name] Difficulty breathing  . Swelling of your face and throat  . A fast heartbeat  . A bad rash all over your body  . Dizziness and weakness    Immunizations Administered    Name Date Dose VIS Date Route   Pfizer COVID-19 Vaccine 11/29/2019 11:54 AM 0.3 mL 10/25/2019 Intramuscular   Manufacturer: [place]   Lot: [id number]   [place]: [id number]

## 2019-12-20 ENCOUNTER — Ambulatory Visit: Payer: Medicare PPO | Attending: Internal Medicine

## 2019-12-20 DIAGNOSIS — Z23 Encounter for immunization: Secondary | ICD-10-CM | POA: Insufficient documentation

## 2019-12-20 NOTE — Progress Notes (Signed)
   Covid-19 Vaccination Clinic  Name:  [facility]    MRN: [id number] DOB: 1941/08/15  12/20/2019  [name] was observed post Covid-19 immunization for 15 minutes without incidence. He was provided with Vaccine Information Sheet and instruction to access the V-Safe system.   [name] was instructed to call 911 with any severe reactions post vaccine: [name] Difficulty breathing  . Swelling of your face and throat  . A fast heartbeat  . A bad rash all over your body  . Dizziness and weakness    Immunizations Administered    Name Date Dose VIS Date Route   Pfizer COVID-19 Vaccine 12/20/2019 12:40 PM 0.3 mL 10/25/2019 Intramuscular   Manufacturer: [place]   Lot: [id number]   [place]: [id number]

## 2020-01-28 ENCOUNTER — Telehealth: Payer: Self-pay

## 2020-01-28 ENCOUNTER — Inpatient Hospital Stay: Payer: Medicare PPO

## 2020-01-28 ENCOUNTER — Encounter: Payer: Self-pay | Admitting: Hematology and Oncology

## 2020-01-28 ENCOUNTER — Inpatient Hospital Stay: Payer: Medicare PPO | Attending: Hematology and Oncology | Admitting: Hematology and Oncology

## 2020-01-28 ENCOUNTER — Other Ambulatory Visit: Payer: Self-pay

## 2020-01-28 ENCOUNTER — Telehealth: Payer: Self-pay | Admitting: Hematology and Oncology

## 2020-01-28 VITALS — BP 130/88 | HR 66 | Temp 98.3°F | Resp 18 | Ht 72.04 in | Wt 160.8 lb

## 2020-01-28 DIAGNOSIS — Z8572 Personal history of non-Hodgkin lymphomas: Secondary | ICD-10-CM

## 2020-01-28 DIAGNOSIS — Z8579 Personal history of other malignant neoplasms of lymphoid, hematopoietic and related tissues: Secondary | ICD-10-CM

## 2020-01-28 DIAGNOSIS — R634 Abnormal weight loss: Secondary | ICD-10-CM

## 2020-01-28 LAB — CBC WITH DIFFERENTIAL/PLATELET
Abs Immature Granulocytes: 0.01 10*3/uL (ref 0.00–0.07)
Basophils Absolute: 0 10*3/uL (ref 0.0–0.1)
Basophils Relative: 1 %
Eosinophils Absolute: 0.1 10*3/uL (ref 0.0–0.5)
Eosinophils Relative: 2 %
HCT: 44.1 % (ref 39.0–52.0)
Hemoglobin: 14.9 g/dL (ref 13.0–17.0)
Immature Granulocytes: 0 %
Lymphocytes Relative: 22 %
Lymphs Abs: 1.6 10*3/uL (ref 0.7–4.0)
MCH: 30.5 pg (ref 26.0–34.0)
MCHC: 33.8 g/dL (ref 30.0–36.0)
MCV: 90.2 fL (ref 80.0–100.0)
Monocytes Absolute: 0.8 10*3/uL (ref 0.1–1.0)
Monocytes Relative: 12 %
Neutro Abs: 4.7 10*3/uL (ref 1.7–7.7)
Neutrophils Relative %: 63 %
Platelets: 286 10*3/uL (ref 150–400)
RBC: 4.89 MIL/uL (ref 4.22–5.81)
RDW: 13.2 % (ref 11.5–15.5)
WBC: 7.2 10*3/uL (ref 4.0–10.5)
nRBC: 0 % (ref 0.0–0.2)

## 2020-01-28 LAB — COMPREHENSIVE METABOLIC PANEL
ALT: 35 U/L (ref 0–44)
AST: 24 U/L (ref 15–41)
Albumin: 4.1 g/dL (ref 3.5–5.0)
Alkaline Phosphatase: 87 U/L (ref 38–126)
Anion gap: 8 (ref 5–15)
BUN: 14 mg/dL (ref 8–23)
CO2: 30 mmol/L (ref 22–32)
Calcium: 8.6 mg/dL — ABNORMAL LOW (ref 8.9–10.3)
Chloride: 101 mmol/L (ref 98–111)
Creatinine, Ser: 0.8 mg/dL (ref 0.61–1.24)
GFR calc Af Amer: >60 mL/min (ref >60)
GFR calc non Af Amer: >60 mL/min (ref >60)
Glucose, Bld: 101 mg/dL — ABNORMAL HIGH (ref 70–99)
Potassium: 4.2 mmol/L (ref 3.5–5.1)
Sodium: 139 mmol/L (ref 135–145)
Total Bilirubin: 0.4 mg/dL (ref 0.3–1.2)
Total Protein: 6.7 g/dL (ref 6.5–8.1)

## 2020-01-28 LAB — TSH: TSH: 2.062 u[IU]/mL (ref 0.320–4.118)

## 2020-01-28 MED ORDER — PREDNISONE 50 MG PO TABS: ORAL_TABLET | ORAL | 0 refills | Status: DC

## 2020-01-28 NOTE — Assessment & Plan Note (Signed)
Clinically, he does not have palpable lymphadenopathy but given his significant, progressive weight loss, I recommend CT scan of the chest, abdomen and pelvis to assess internal lymphadenopathy that are not palpable on exam I will see him next week for further follow-up

## 2020-01-28 NOTE — Telephone Encounter (Signed)
Called and offered appt at 1145, in person or virtual on 3/23. He prefers in person. Appt scheduled at 1145 on 3/23. Scheduling message sent.

## 2020-01-28 NOTE — Telephone Encounter (Signed)
Scheduled per 3/16 sch msg. Pt made aware of appt by RN

## 2020-01-28 NOTE — Telephone Encounter (Signed)
Called and given TSH results per Dr. Alvy Bimler, results are normal. He verbalized understanding.

## 2020-01-28 NOTE — Assessment & Plan Note (Signed)
He has significant progressive weight loss Thyroid function tests is normal I recommend CT imaging as above and he agreed Due to prior history of contrast allergy, he will receive premedication

## 2020-01-28 NOTE — Progress Notes (Signed)
[place] OFFICE PROGRESS NOTE  Patient Care Team: Lawerance Cruel, MD as PCP - General (Family Medicine) Ruby Cola, MD as Attending Physician (Otolaryngology) Thea Silversmith, MD (Radiation Oncology) Marylynn Pearson, MD as Attending Physician (Ophthalmology) Leandrew Koyanagi., MD (Urology)  ASSESSMENT & PLAN:  History of lymphoma Clinically, he does not have palpable lymphadenopathy but given his significant, progressive weight loss, I recommend CT scan of the chest, abdomen and pelvis to assess internal lymphadenopathy that are not palpable on exam I will see him next week for further follow-up  Unexplained weight loss He has significant progressive weight loss Thyroid function tests is normal I recommend CT imaging as above and he agreed Due to prior history of contrast allergy, he will receive premedication   Orders Placed This Encounter  Procedures  . CT CHEST W CONTRAST    Standing Status:   Future    Standing Expiration Date:   01/27/2021    Order Specific Question:   If indicated for the ordered procedure, I authorize the administration of contrast media per Radiology protocol    Answer:   Yes    Order Specific Question:   Preferred imaging location?    Answer:   [facility]    Order Specific Question:   Radiology Contrast Protocol - do NOT remove file path    Answer:   \\[email or web address]  . CT ABDOMEN PELVIS W CONTRAST    Standing Status:   Future    Standing Expiration Date:   01/27/2021    Order Specific Question:   If indicated for the ordered procedure, I authorize the administration of contrast media per Radiology protocol    Answer:   Yes    Order Specific Question:   Preferred imaging location?    Answer:   [facility]    Order Specific Question:   Radiology Contrast Protocol - do NOT remove file path    Answer:   \\[email or web address]  . TSH    Standing Status:   Future     Number of Occurrences:   1    Standing Expiration Date:   03/03/2021  . PSA, total and free    Standing Status:   Future    Number of Occurrences:   1    Standing Expiration Date:   03/03/2021    All questions were answered. The patient knows to call the clinic with any problems, questions or concerns. The total time spent in the appointment was 20 minutes encounter with patients including review of chart and various tests results, discussions about plan of care and coordination of care plan   Heath Lark, MD 01/28/2020 12:15 PM  INTERVAL HISTORY: Please see below for problem oriented charting. Returns for further follow-up He has progressive weight loss and he is concerned about this His physical activity is normal He has retained appetite but yet he continues to lose weight He could not remember when he last had his PSA checked He did not think he had thyroid function test checked He has no new palpable lymphadenopathy No recent pain, shortness of breath or cough  SUMMARY OF ONCOLOGIC HISTORY: Oncology History Overview Note  Non-Hodgkin lymphoma   Primary site: Lymphoid Neoplasms   Clinical: Stage I signed by Heath Lark, MD on 01/08/2014 10:33 AM   Pathologic: Stage I signed by Heath Lark, MD on 01/08/2014 10:33 AM   Summary: Stage I     History of lymphoma  12/20/2012 Imaging   MRI showed 8 mm  right subcutaneous lesion is most compatible with a small benign appearing lymph node.    12/26/2012 Surgery   He underwent excisional lymph node biopsy for palpable mass   12/26/2012 Pathology Results   Soft tissue mass, simple excision, right neck - LYMPH NODAL TISSUE WITH ATYPICAL FOLLICULAR PROLIFERATION SUSPICIOUS FOR NON-HODGKIN'S B-CELL LYMPHOMA, SEE COMMENT. Microscopic Comment Much of the specimen consists of soft tissue including fibroadipose tissue and skeletal muscle. The soft tissue contains limited lymph nodal tissue, the largest fragment of which measures 0.8 cm as  estimated from the glass slide. This particular fragment displays a few atypical lymphoid follicles characterized by relatively ill defined mantle zones and a homogeneous composition of medium to large lymphoid cells with vesicular chromatin and prominent nucleoli with lack of obvious polarity or abundant tingible body macrophages. Other fragments of lymphoid tissue in the background contain hyperplastic appearing lymphoid follicles with polarity, well defined mantle zones and tingible body macrophages. Flow cytometric analysis was attempted but was non-contributory likely due to sampling. Immunohistochemical stains for CD3, CD20 and BCL-2 were performed with appropriate controls. The atypical follicles in question stain positively for CD20 (B-cell marker) as well as BCL-2. B-cells in the follicles are surrounded by abundant T-cells as seen with CD3. 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 Additional tissue may be warranted for confirmation and further evaluation.    01/08/2013 Imaging   PET/CT scan was negative   07/08/2013 Imaging   CT scan was negative   01/07/2014 Imaging   CT scan was negative   01/26/2017 PET scan   No adenopathy or significant abnormal hypermetabolic activity to suggest current active lymphoma. Other imaging findings of potential clinical significance: Cholelithiasis. Descending and sigmoid colon diverticulosis.      REVIEW OF SYSTEMS:   Eyes: Denies blurriness of vision Ears, nose, mouth, throat, and face: Denies mucositis or sore throat Respiratory: Denies cough, dyspnea or wheezes Cardiovascular: Denies palpitation, chest discomfort or lower extremity swelling Gastrointestinal:  Denies nausea, heartburn or change in bowel habits Skin: Denies abnormal skin rashes Lymphatics: Denies new lymphadenopathy or easy bruising Neurological:Denies numbness, tingling or new  weaknesses Behavioral/Psych: Mood is stable, no new changes  All other systems were reviewed with the patient and are negative.  I have reviewed the past medical history, past surgical history, social history and family history with the patient and they are unchanged from previous note.  ALLERGIES:  is allergic to iodinated diagnostic agents.  MEDICATIONS:  Current Outpatient Medications  Medication Sig Dispense Refill  . aspirin 81 MG tablet Take 81 mg by mouth 3 (three) times a week.    . cholecalciferol (VITAMIN D) 1000 units tablet Take 2,000 Units by mouth daily.    . Multiple Vitamin (MULTIVITAMIN) tablet Take 1 tablet by mouth daily.    . predniSONE (DELTASONE) 50 MG tablet Take 1 pill at 13 hours, 7 hours and 1 hour before CT scan 3 tablet 0  . VYZULTA 0.024 % SOLN Apply 1 drop topically daily.     No current facility-administered medications for this visit.    PHYSICAL EXAMINATION: ECOG PERFORMANCE STATUS: 1 - Symptomatic but completely ambulatory  Vitals:   01/28/20 0900  BP: 130/88  Pulse: 66  Resp: 18  Temp: 98.3 F (36.8 C)  SpO2: 100%   Filed Weights   01/28/20 0900  Weight: 160 lb 12.8 oz (72.9 kg)    GENERAL:alert, no distress and comfortable.  He looks  mildly cachectic SKIN: skin color, texture, turgor are normal, no rashes or significant lesions EYES: normal, Conjunctiva are pink and non-injected, sclera clear OROPHARYNX:no exudate, no erythema and lips, buccal mucosa, and tongue normal  NECK: supple, thyroid normal size, non-tender, without nodularity LYMPH:  no palpable lymphadenopathy in the cervical, axillary or inguinal LUNGS: clear to auscultation and percussion with normal breathing effort HEART: regular rate & rhythm and no murmurs and no lower extremity edema ABDOMEN:abdomen soft, non-tender and normal bowel sounds Musculoskeletal:no cyanosis of digits and no clubbing  NEURO: alert & oriented x 3 with fluent speech, no focal motor/sensory  deficits  LABORATORY DATA:  I have reviewed the data as listed    Component Value Date/Time   NA 139 01/28/2020 0831   NA 139 01/13/2017 0927   K 4.2 01/28/2020 0831   K 4.3 01/13/2017 0927   CL 101 01/28/2020 0831   CL 103 01/04/2013 1113   CO2 30 01/28/2020 0831   CO2 28 01/13/2017 0927   GLUCOSE 101 (H) 01/28/2020 0831   GLUCOSE 71 01/13/2017 0927   GLUCOSE 106 (H) 01/04/2013 1113   BUN 14 01/28/2020 0831   BUN 16.0 01/13/2017 0927   CREATININE 0.80 01/28/2020 0831   CREATININE 0.8 01/13/2017 0927   CALCIUM 8.6 (L) 01/28/2020 0831   CALCIUM 8.9 01/13/2017 0927   PROT 6.7 01/28/2020 0831   PROT 6.7 01/13/2017 0927   ALBUMIN 4.1 01/28/2020 0831   ALBUMIN 4.0 01/13/2017 0927   AST 24 01/28/2020 0831   AST 24 01/13/2017 0927   ALT 35 01/28/2020 0831   ALT 34 01/13/2017 0927   ALKPHOS 87 01/28/2020 0831   ALKPHOS 97 01/13/2017 0927   BILITOT 0.4 01/28/2020 0831   BILITOT 0.57 01/13/2017 0927   GFRNONAA >60 01/28/2020 0831   GFRAA >60 01/28/2020 0831    No results found for: SPEP, UPEP  Lab Results  Component Value Date   WBC 7.2 01/28/2020   NEUTROABS 4.7 01/28/2020   HGB 14.9 01/28/2020   HCT 44.1 01/28/2020   MCV 90.2 01/28/2020   PLT 286 01/28/2020      Chemistry      Component Value Date/Time   NA 139 01/28/2020 0831   NA 139 01/13/2017 0927   K 4.2 01/28/2020 0831   K 4.3 01/13/2017 0927   CL 101 01/28/2020 0831   CL 103 01/04/2013 1113   CO2 30 01/28/2020 0831   CO2 28 01/13/2017 0927   BUN 14 01/28/2020 0831   BUN 16.0 01/13/2017 0927   CREATININE 0.80 01/28/2020 0831   CREATININE 0.8 01/13/2017 0927      Component Value Date/Time   CALCIUM 8.6 (L) 01/28/2020 0831   CALCIUM 8.9 01/13/2017 0927   ALKPHOS 87 01/28/2020 0831   ALKPHOS 97 01/13/2017 0927   AST 24 01/28/2020 0831   AST 24 01/13/2017 0927   ALT 35 01/28/2020 0831   ALT 34 01/13/2017 0927   BILITOT 0.4 01/28/2020 0831   BILITOT 0.57 01/13/2017 [id number]

## 2020-01-29 LAB — PSA, TOTAL AND FREE
PSA, Free Pct: 36.9 %
PSA, Free: 0.59 ng/mL
Prostate Specific Ag, Serum: 1.6 ng/mL (ref 0.0–4.0)

## 2020-02-04 ENCOUNTER — Ambulatory Visit (HOSPITAL_COMMUNITY)
Admission: RE | Admit: 2020-02-04 | Discharge: 2020-02-04 | Disposition: A | Discharge status: Home / Self Care | Payer: Medicare PPO | Source: Ambulatory Visit | Attending: Hematology and Oncology | Admitting: Hematology and Oncology

## 2020-02-04 ENCOUNTER — Telehealth: Payer: Self-pay | Admitting: Hematology and Oncology

## 2020-02-04 ENCOUNTER — Encounter (HOSPITAL_COMMUNITY): Payer: Self-pay

## 2020-02-04 ENCOUNTER — Encounter: Payer: Self-pay | Admitting: Hematology and Oncology

## 2020-02-04 ENCOUNTER — Other Ambulatory Visit: Payer: Self-pay

## 2020-02-04 ENCOUNTER — Inpatient Hospital Stay: Payer: Medicare PPO | Admitting: Hematology and Oncology

## 2020-02-04 DIAGNOSIS — K8020 Calculus of gallbladder without cholecystitis without obstruction: Secondary | ICD-10-CM

## 2020-02-04 DIAGNOSIS — Z8579 Personal history of other malignant neoplasms of lymphoid, hematopoietic and related tissues: Secondary | ICD-10-CM

## 2020-02-04 DIAGNOSIS — R634 Abnormal weight loss: Secondary | ICD-10-CM | POA: Insufficient documentation

## 2020-02-04 DIAGNOSIS — K5909 Other constipation: Secondary | ICD-10-CM | POA: Insufficient documentation

## 2020-02-04 MED ORDER — IOHEXOL 300 MG/ML  SOLN
100.0000 mL | Freq: Once | INTRAMUSCULAR | Status: AC | PRN
  Administered 2020-02-04: 100 mL via INTRAVENOUS

## 2020-02-04 MED ORDER — SODIUM CHLORIDE (PF) 0.9 % IJ SOLN
INTRAMUSCULAR | Status: AC
  Filled 2020-02-04: qty 50

## 2020-02-04 NOTE — Assessment & Plan Note (Signed)
I noted stool burden on CT imaging The patient has been taking fiber without much success I recommend doses of laxative such as MiraLAX to help with constipation

## 2020-02-04 NOTE — Assessment & Plan Note (Addendum)
He has mild hypocalcemia but not symptomatic I recommend increase vitamin D to 2000 units and to add calcium carbonate for calcium supplement

## 2020-02-04 NOTE — Telephone Encounter (Signed)
Scheduled per 3/23 sch msg. Called and left a msg. Mailing printout

## 2020-02-04 NOTE — Progress Notes (Signed)
[place] OFFICE PROGRESS NOTE  Patient Care Team: Lawerance Cruel, MD as PCP - General (Family Medicine) Ruby Cola, MD as Attending Physician (Otolaryngology) Marylynn Pearson, MD as Attending Physician (Ophthalmology) Leandrew Koyanagi., MD (Urology)  ASSESSMENT & PLAN:  History of lymphoma I have reviewed the CT imaging with the patient extensively He has no signs of cancer recurrence The patient was diagnosed with only stage I lymphoma that was treated and he has been in remission for almost 7 years We discussed the risk and benefits of future long-term follow-up The patient desired to return here again in a year I will see him back at that time  Unexplained weight loss We discussed risk factors for weight loss He has no evidence of cancer, although, some types of prostate cancer, and GI cancer cannot be completely excluded on CT imaging alone He had negative colonoscopy in 2020 and he has no upper GI symptoms We discussed the importance of weightbearing exercise to gain muscle weight and he will try to track his oral intake better  Gallstones He has persistent gallstones noted on CT imaging He is not symptomatic We discussed some of the symptoms to watch out for acute cholecystitis such as right upper quadrant pain and nausea   Chronic constipation I noted stool burden on CT imaging The patient has been taking fiber without much success I recommend doses of laxative such as MiraLAX to help with constipation  Hypocalcemia He has mild hypocalcemia but not symptomatic I recommend increase vitamin D to 2000 units and to add calcium carbonate for calcium supplement   No orders of the defined types were placed in this encounter.   All questions were answered. The patient knows to call the clinic with any problems, questions or concerns. The total time spent in the appointment was 20 minutes encounter with patients including review of chart and various  tests results, discussions about plan of care and coordination of care plan   Heath Lark, MD 02/04/2020 1:02 PM  INTERVAL HISTORY: Please see below for problem oriented charting. He returns to review CT imaging result for unexplained weight loss He has some chronic constipation intermittently and he take fiber pill He recalled negative colonoscopy a year ago but due to colon polyp, he is scheduled for return colonoscopy in 2023 SUMMARY OF ONCOLOGIC HISTORY: Oncology History Overview Note  Non-Hodgkin lymphoma   Primary site: Lymphoid Neoplasms   Clinical: Stage I signed by Heath Lark, MD on 01/08/2014 10:33 AM   Pathologic: Stage I signed by Heath Lark, MD on 01/08/2014 10:33 AM   Summary: Stage I     History of lymphoma  12/20/2012 Imaging   MRI showed 8 mm right subcutaneous lesion is most compatible with a small benign appearing lymph node.    12/26/2012 Surgery   He underwent excisional lymph node biopsy for palpable mass   12/26/2012 Pathology Results   Soft tissue mass, simple excision, right neck - LYMPH NODAL TISSUE WITH ATYPICAL FOLLICULAR PROLIFERATION SUSPICIOUS FOR NON-HODGKIN'S B-CELL LYMPHOMA, SEE COMMENT. Microscopic Comment Much of the specimen consists of soft tissue including fibroadipose tissue and skeletal muscle. The soft tissue contains limited lymph nodal tissue, the largest fragment of which measures 0.8 cm as estimated from the glass slide. This particular fragment displays a few atypical lymphoid follicles characterized by relatively ill defined mantle zones and a homogeneous composition of medium to large lymphoid cells with vesicular chromatin and prominent nucleoli with lack of obvious polarity or abundant tingible  body macrophages. Other fragments of lymphoid tissue in the background contain hyperplastic appearing lymphoid follicles with polarity, well defined mantle zones and tingible body macrophages. Flow cytometric analysis was attempted but was  non-contributory likely due to sampling. Immunohistochemical stains for CD3, CD20 and BCL-2 were performed with appropriate controls. The atypical follicles in question stain positively for CD20 (B-cell marker) as well as BCL-2. B-cells in the follicles are surrounded by abundant T-cells as seen with CD3. 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 Additional tissue may be warranted for confirmation and further evaluation.    01/08/2013 Imaging   PET/CT scan was negative   07/08/2013 Imaging   CT scan was negative   01/07/2014 Imaging   CT scan was negative   01/26/2017 PET scan   No adenopathy or significant abnormal hypermetabolic activity to suggest current active lymphoma. Other imaging findings of potential clinical significance: Cholelithiasis. Descending and sigmoid colon diverticulosis.      REVIEW OF SYSTEMS:   Constitutional: Denies fevers, chills or abnormal weight loss Eyes: Denies blurriness of vision Ears, nose, mouth, throat, and face: Denies mucositis or sore throat Respiratory: Denies cough, dyspnea or wheezes Cardiovascular: Denies palpitation, chest discomfort or lower extremity swelling Skin: Denies abnormal skin rashes Lymphatics: Denies new lymphadenopathy or easy bruising Neurological:Denies numbness, tingling or new weaknesses Behavioral/Psych: Mood is stable, no new changes  All other systems were reviewed with the patient and are negative.  I have reviewed the past medical history, past surgical history, social history and family history with the patient and they are unchanged from previous note.  ALLERGIES:  is allergic to iodinated diagnostic agents.  MEDICATIONS:  Current Outpatient Medications  Medication Sig Dispense Refill  . aspirin 81 MG tablet Take 81 mg by mouth 3 (three) times a week.    . cholecalciferol (VITAMIN D) 1000 units tablet Take 2,000 Units by  mouth daily.    . Multiple Vitamin (MULTIVITAMIN) tablet Take 1 tablet by mouth daily.    . predniSONE (DELTASONE) 50 MG tablet Take 1 pill at 13 hours, 7 hours and 1 hour before CT scan 3 tablet 0  . VYZULTA 0.024 % SOLN Apply 1 drop topically daily.     No current facility-administered medications for this visit.   Facility-Administered Medications Ordered in Other Visits  Medication Dose Route Frequency Provider Last Rate Last Admin  . sodium chloride (PF) 0.9 % injection             PHYSICAL EXAMINATION: ECOG PERFORMANCE STATUS: 0 - Asymptomatic  Vitals:   02/04/20 1204  BP: 135/72  Pulse: 81  Resp: 18  Temp: 97.8 F (36.6 C)  SpO2: 97%   Filed Weights   02/04/20 1204  Weight: 167 lb 6.4 oz (75.9 kg)    GENERAL:alert, no distress and comfortable NEURO: alert & oriented x 3 with fluent speech, no focal motor/sensory deficits  LABORATORY DATA:  I have reviewed the data as listed    Component Value Date/Time   NA 139 01/28/2020 0831   NA 139 01/13/2017 0927   K 4.2 01/28/2020 0831   K 4.3 01/13/2017 0927   CL 101 01/28/2020 0831   CL 103 01/04/2013 1113   CO2 30 01/28/2020 0831   CO2 28 01/13/2017 0927   GLUCOSE 101 (H) 01/28/2020 0831   GLUCOSE 71 01/13/2017 0927   GLUCOSE 106 (H) 01/04/2013 1113   BUN 14 01/28/2020 0831   BUN 16.0 01/13/2017  [id number]   CREATININE 0.80 01/28/2020 0831   CREATININE 0.8 01/13/2017 0927   CALCIUM 8.6 (L) 01/28/2020 0831   CALCIUM 8.9 01/13/2017 0927   PROT 6.7 01/28/2020 0831   PROT 6.7 01/13/2017 0927   ALBUMIN 4.1 01/28/2020 0831   ALBUMIN 4.0 01/13/2017 0927   AST 24 01/28/2020 0831   AST 24 01/13/2017 0927   ALT 35 01/28/2020 0831   ALT 34 01/13/2017 0927   ALKPHOS 87 01/28/2020 0831   ALKPHOS 97 01/13/2017 0927   BILITOT 0.4 01/28/2020 0831   BILITOT 0.57 01/13/2017 0927   GFRNONAA >60 01/28/2020 0831   GFRAA >60 01/28/2020 0831    No results found for: SPEP, UPEP  Lab Results  Component Value Date   WBC 7.2  01/28/2020   NEUTROABS 4.7 01/28/2020   HGB 14.9 01/28/2020   HCT 44.1 01/28/2020   MCV 90.2 01/28/2020   PLT 286 01/28/2020      Chemistry      Component Value Date/Time   NA 139 01/28/2020 0831   NA 139 01/13/2017 0927   K 4.2 01/28/2020 0831   K 4.3 01/13/2017 0927   CL 101 01/28/2020 0831   CL 103 01/04/2013 1113   CO2 30 01/28/2020 0831   CO2 28 01/13/2017 0927   BUN 14 01/28/2020 0831   BUN 16.0 01/13/2017 0927   CREATININE 0.80 01/28/2020 0831   CREATININE 0.8 01/13/2017 0927      Component Value Date/Time   CALCIUM 8.6 (L) 01/28/2020 0831   CALCIUM 8.9 01/13/2017 0927   ALKPHOS 87 01/28/2020 0831   ALKPHOS 97 01/13/2017 0927   AST 24 01/28/2020 0831   AST 24 01/13/2017 0927   ALT 35 01/28/2020 0831   ALT 34 01/13/2017 0927   BILITOT 0.4 01/28/2020 0831   BILITOT 0.57 01/13/2017 0927       RADIOGRAPHIC STUDIES: I have reviewed imaging studies with the patient extensively I have personally reviewed the radiological images as listed and agreed with the findings in the report. CT CHEST W CONTRAST  Result Date: 02/04/2020 CLINICAL DATA:  NHL, diagnosed 2014. Right neck lymph node dissection. Prior appendectomy. EXAM: CT CHEST, ABDOMEN, AND PELVIS WITH CONTRAST TECHNIQUE: Multidetector CT imaging of the chest, abdomen and pelvis was performed following the standard protocol during bolus administration of intravenous contrast. CONTRAST:  140mL OMNIPAQUE IOHEXOL 300 MG/ML  SOLN COMPARISON:  PET CT dated 01/26/2017. FINDINGS: CT CHEST FINDINGS Cardiovascular: Heart is normal in size.  No pericardial effusion. Visualized thyroid is unremarkable. Mediastinum/Nodes: No suspicious mediastinal, hilar, or axillary lymphadenopathy. Visualized thyroid is unremarkable. Lungs/Pleura: Lungs are essentially clear. No suspicious pulmonary nodules. 3 mm nodule in the right lung apex (series 6/image 36), unchanged from 2014, benign. No focal consolidation. No pleural effusion or  pneumothorax. Musculoskeletal: Mild degenerative changes of the mid/lower thoracic spine. CT ABDOMEN PELVIS FINDINGS Hepatobiliary: Liver is within normal limits. Dominant 2 cm gallstone. No associated inflammatory changes. No intrahepatic or extrahepatic ductal dilatation. Pancreas: Within normal limits. Spleen: Spleen is normal in size. Adrenals/Urinary Tract: Adrenal glands are within normal limits. 6 mm posterior polar right renal cyst (series 7/image 15). Left renal sinus cysts. No hydronephrosis. Bladder is mildly thick-walled although underdistended. Stomach/Bowel: Stomach is within normal limits. No evidence of bowel obstruction. Prior appendectomy. Sigmoid diverticulosis, without evidence of diverticulitis. Vascular/Lymphatic: No evidence of abdominal aortic aneurysm. No suspicious abdominopelvic lymphadenopathy. Reproductive: Prostate is unremarkable. Other: No abdominopelvic ascites. Musculoskeletal: Degenerative changes of the lumbar spine. IMPRESSION: No suspicious lymphadenopathy in the chest,  abdomen, or pelvis. Spleen is normal in size. Cholelithiasis, without associated inflammatory changes. Electronically Signed   By: Julian Hy M.D.   On: 02/04/2020 08:57   CT ABDOMEN PELVIS W CONTRAST  Result Date: 02/04/2020 CLINICAL DATA:  NHL, diagnosed 2014. Right neck lymph node dissection. Prior appendectomy. EXAM: CT CHEST, ABDOMEN, AND PELVIS WITH CONTRAST TECHNIQUE: Multidetector CT imaging of the chest, abdomen and pelvis was performed following the standard protocol during bolus administration of intravenous contrast. CONTRAST:  124mL OMNIPAQUE IOHEXOL 300 MG/ML  SOLN COMPARISON:  PET CT dated 01/26/2017. FINDINGS: CT CHEST FINDINGS Cardiovascular: Heart is normal in size.  No pericardial effusion. Visualized thyroid is unremarkable. Mediastinum/Nodes: No suspicious mediastinal, hilar, or axillary lymphadenopathy. Visualized thyroid is unremarkable. Lungs/Pleura: Lungs are essentially clear.  No suspicious pulmonary nodules. 3 mm nodule in the right lung apex (series 6/image 36), unchanged from 2014, benign. No focal consolidation. No pleural effusion or pneumothorax. Musculoskeletal: Mild degenerative changes of the mid/lower thoracic spine. CT ABDOMEN PELVIS FINDINGS Hepatobiliary: Liver is within normal limits. Dominant 2 cm gallstone. No associated inflammatory changes. No intrahepatic or extrahepatic ductal dilatation. Pancreas: Within normal limits. Spleen: Spleen is normal in size. Adrenals/Urinary Tract: Adrenal glands are within normal limits. 6 mm posterior polar right renal cyst (series 7/image 15). Left renal sinus cysts. No hydronephrosis. Bladder is mildly thick-walled although underdistended. Stomach/Bowel: Stomach is within normal limits. No evidence of bowel obstruction. Prior appendectomy. Sigmoid diverticulosis, without evidence of diverticulitis. Vascular/Lymphatic: No evidence of abdominal aortic aneurysm. No suspicious abdominopelvic lymphadenopathy. Reproductive: Prostate is unremarkable. Other: No abdominopelvic ascites. Musculoskeletal: Degenerative changes of the lumbar spine. IMPRESSION: No suspicious lymphadenopathy in the chest, abdomen, or pelvis. Spleen is normal in size. Cholelithiasis, without associated inflammatory changes. Electronically Signed   By: Julian Hy M.D.   On: 02/04/2020 08:57

## 2020-02-04 NOTE — Assessment & Plan Note (Signed)
We discussed risk factors for weight loss He has no evidence of cancer, although, some types of prostate cancer, and GI cancer cannot be completely excluded on CT imaging alone He had negative colonoscopy in 2020 and he has no upper GI symptoms We discussed the importance of weightbearing exercise to gain muscle weight and he will try to track his oral intake better

## 2020-02-04 NOTE — Assessment & Plan Note (Signed)
He has persistent gallstones noted on CT imaging He is not symptomatic We discussed some of the symptoms to watch out for acute cholecystitis such as right upper quadrant pain and nausea

## 2020-02-04 NOTE — Assessment & Plan Note (Signed)
I have reviewed the CT imaging with the patient extensively He has no signs of cancer recurrence The patient was diagnosed with only stage I lymphoma that was treated and he has been in remission for almost 7 years We discussed the risk and benefits of future long-term follow-up The patient desired to return here again in a year I will see him back at that time

## 2020-04-27 IMAGING — CT CT CHEST W/ CM
3 of 5 series · 16 of 36 positions shown, 18 images · IV contrast (OMNIPAQUE)
Comparison: PET CT dated 01/26/2017.

CLINICAL DATA: NHL, diagnosed 9511. Right neck lymph node
dissection. Prior appendectomy.

EXAM:
CT CHEST, ABDOMEN, AND PELVIS WITH CONTRAST
TECHNIQUE: Multidetector CT imaging of the chest, abdomen and pelvis was
performed following the standard protocol during bolus
administration of intravenous contrast.
CONTRAST:  100mL OMNIPAQUE IOHEXOL 300 MG/ML  SOLN

[Series 2: cap with · axial · 0.75mm/px · z∈[+839,+1379]mm · 8 of 140 slices shown, 10 images]
[im 16/140  mediastinal]
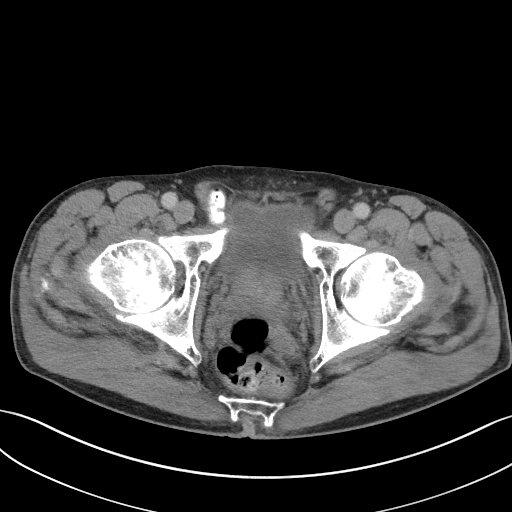
[im 16/140  lung]
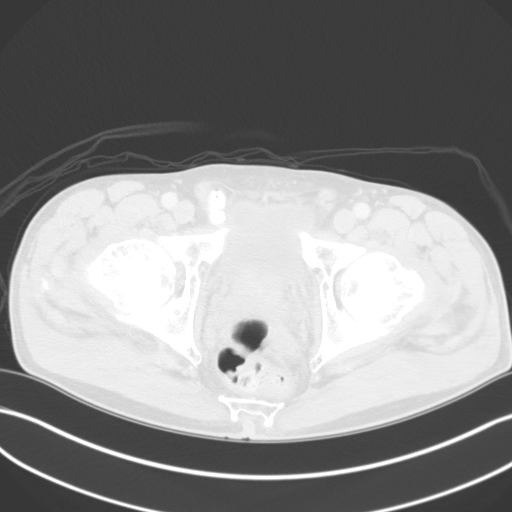
[im 31/140  lung]
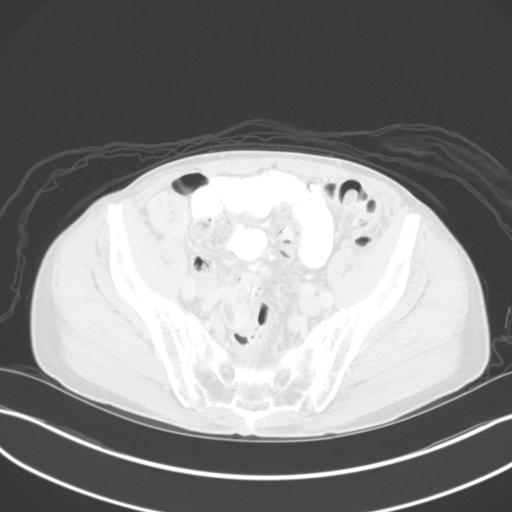
[im 47/140  lung]
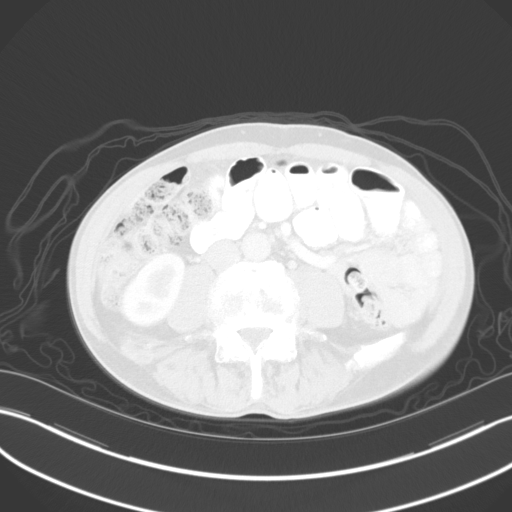
[im 62/140  lung]
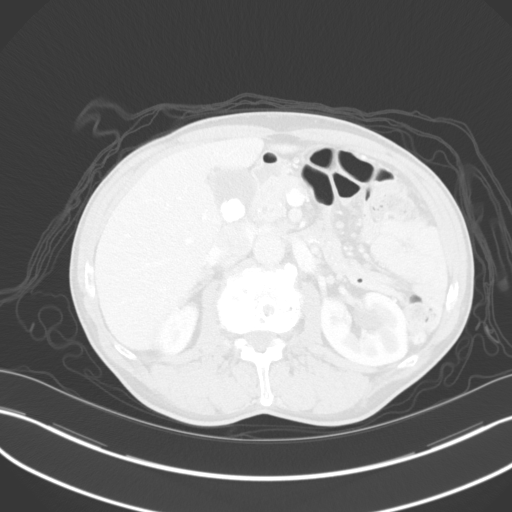
[im 78/140  mediastinal]
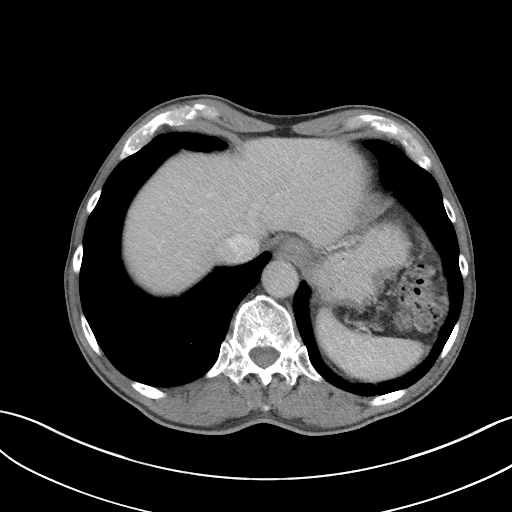
[im 78/140  lung]
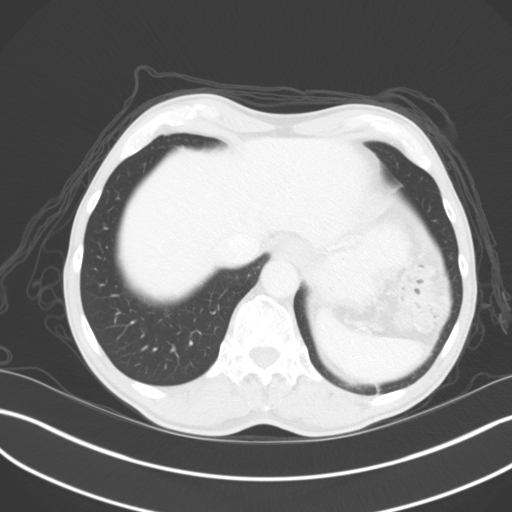
[im 93/140  lung]
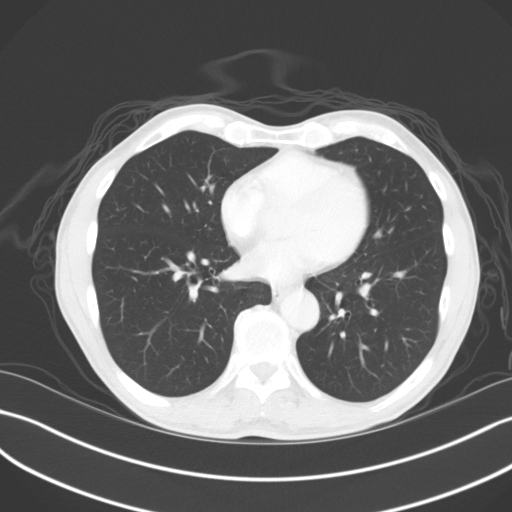
[im 109/140  lung]
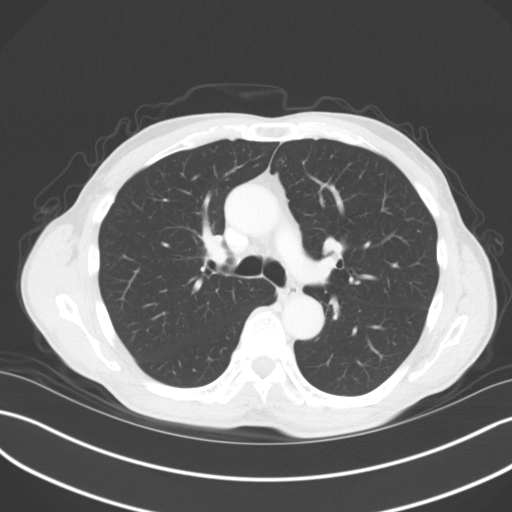
[im 124/140  lung]
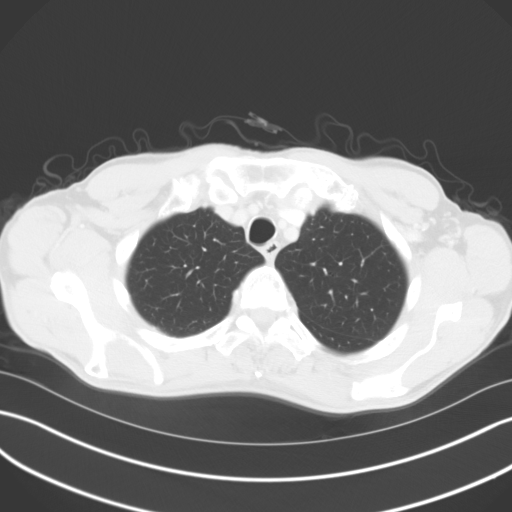

[Series 4: coronals · coronal · 1.11mm/px · 3 of 136 slices shown]
[im 28/136  lung]
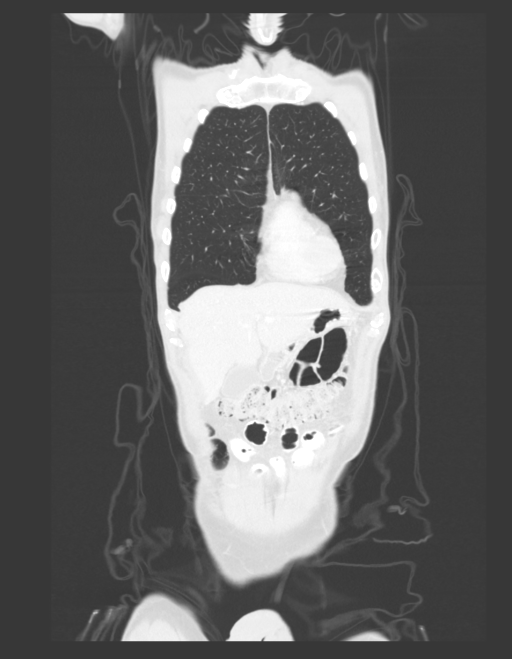
[im 55/136  lung]
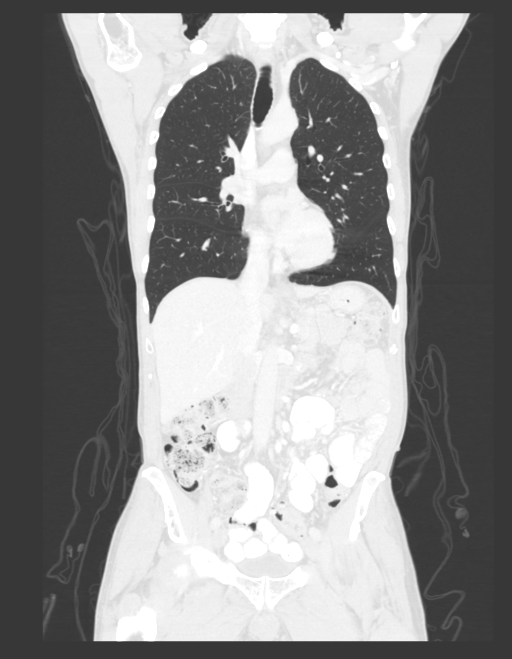
[im 82/136  lung]
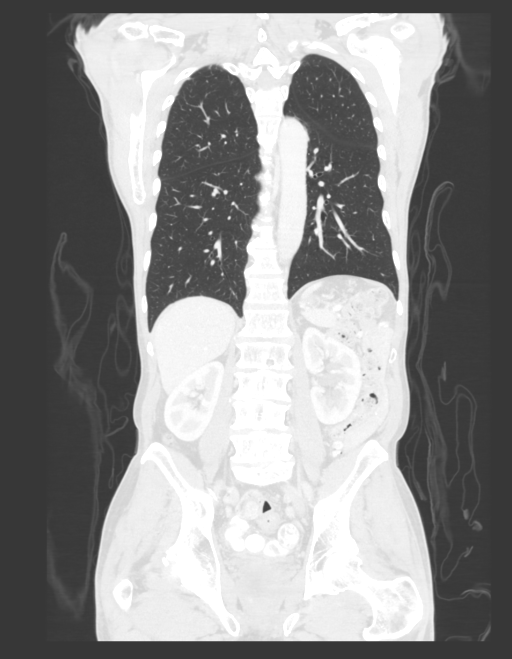

[Series 6: lung · axial · 0.75mm/px · z∈[+1095,+1291]mm · 5 of 196 slices shown]
[im 14/196  lung]
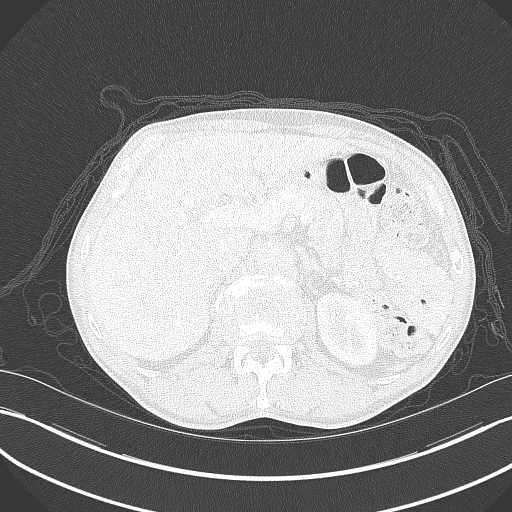
[im 42/196  lung]
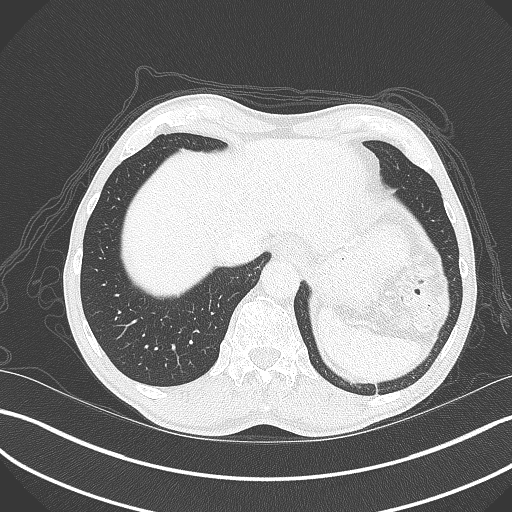
[im 70/196  lung]
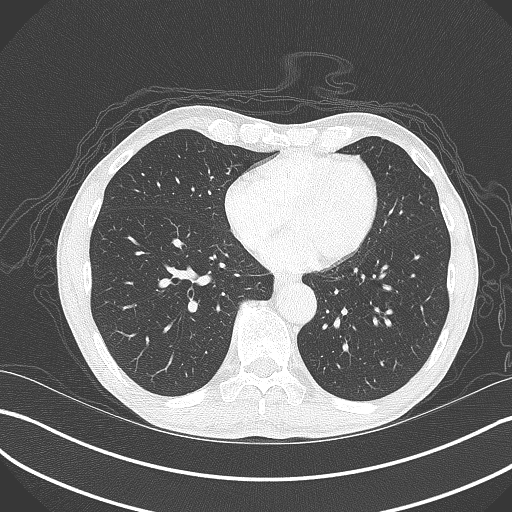
[im 84/196  lung]
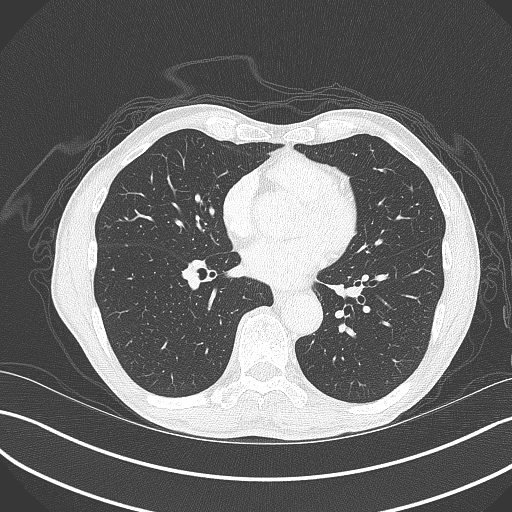
[im 112/196  lung]
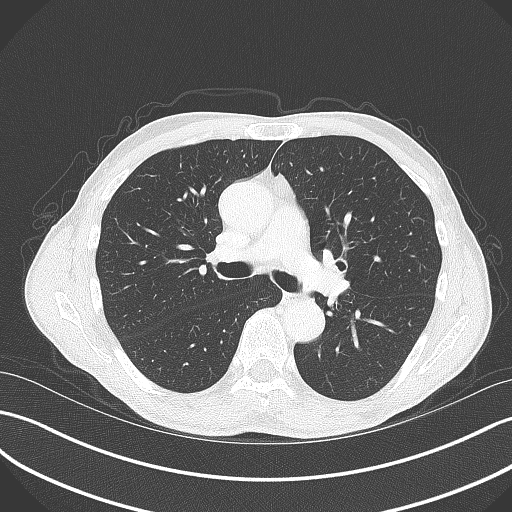

[16 of 36 positions shown; findings below may reference images not displayed]

FINDINGS: CT CHEST FINDINGS

Cardiovascular: Heart is normal in size.  No pericardial effusion.

Visualized thyroid is unremarkable.

Mediastinum/Nodes: No suspicious mediastinal, hilar, or axillary
lymphadenopathy.

Visualized thyroid is unremarkable.

Lungs/Pleura: Lungs are essentially clear.

No suspicious pulmonary nodules. 3 mm nodule in the right lung apex
(series 6/image 36), unchanged from 9511, benign.

No focal consolidation.

No pleural effusion or pneumothorax.

Musculoskeletal: Mild degenerative changes of the mid/lower thoracic
spine.

CT ABDOMEN PELVIS FINDINGS

Hepatobiliary: Liver is within normal limits.

Dominant 2 cm gallstone. No associated inflammatory changes. No
intrahepatic or extrahepatic ductal dilatation.

Pancreas: Within normal limits.

Spleen: Spleen is normal in size.

Adrenals/Urinary Tract: Adrenal glands are within normal limits.

6 mm posterior polar right renal cyst (series 7/image 15). Left
renal sinus cysts. No hydronephrosis.

Bladder is mildly thick-walled although underdistended.

Stomach/Bowel: Stomach is within normal limits.

No evidence of bowel obstruction.

Prior appendectomy.

Sigmoid diverticulosis, without evidence of diverticulitis.

Vascular/Lymphatic: No evidence of abdominal aortic aneurysm.

No suspicious abdominopelvic lymphadenopathy.

Reproductive: Prostate is unremarkable.

Other: No abdominopelvic ascites.

Musculoskeletal: Degenerative changes of the lumbar spine.
IMPRESSION: No suspicious lymphadenopathy in the chest, abdomen, or pelvis.

Spleen is normal in size.

Cholelithiasis, without associated inflammatory changes.

## 2020-09-24 DIAGNOSIS — L814 Other melanin hyperpigmentation: Secondary | ICD-10-CM | POA: Diagnosis not present

## 2020-09-24 DIAGNOSIS — Z85828 Personal history of other malignant neoplasm of skin: Secondary | ICD-10-CM | POA: Diagnosis not present

## 2020-09-24 DIAGNOSIS — D692 Other nonthrombocytopenic purpura: Secondary | ICD-10-CM | POA: Diagnosis not present

## 2020-09-24 DIAGNOSIS — I8391 Asymptomatic varicose veins of right lower extremity: Secondary | ICD-10-CM | POA: Diagnosis not present

## 2020-09-24 DIAGNOSIS — D1801 Hemangioma of skin and subcutaneous tissue: Secondary | ICD-10-CM | POA: Diagnosis not present

## 2020-09-24 DIAGNOSIS — D225 Melanocytic nevi of trunk: Secondary | ICD-10-CM | POA: Diagnosis not present

## 2020-09-24 DIAGNOSIS — L570 Actinic keratosis: Secondary | ICD-10-CM | POA: Diagnosis not present

## 2020-09-24 DIAGNOSIS — L821 Other seborrheic keratosis: Secondary | ICD-10-CM | POA: Diagnosis not present

## 2020-09-28 DIAGNOSIS — R634 Abnormal weight loss: Secondary | ICD-10-CM | POA: Diagnosis not present

## 2020-09-28 DIAGNOSIS — Z86010 Personal history of colonic polyps: Secondary | ICD-10-CM | POA: Diagnosis not present

## 2020-10-14 DIAGNOSIS — H6122 Impacted cerumen, left ear: Secondary | ICD-10-CM | POA: Diagnosis not present

## 2020-10-14 DIAGNOSIS — Z136 Encounter for screening for cardiovascular disorders: Secondary | ICD-10-CM | POA: Diagnosis not present

## 2020-10-14 DIAGNOSIS — N400 Enlarged prostate without lower urinary tract symptoms: Secondary | ICD-10-CM | POA: Diagnosis not present

## 2020-10-14 DIAGNOSIS — K4090 Unilateral inguinal hernia, without obstruction or gangrene, not specified as recurrent: Secondary | ICD-10-CM | POA: Diagnosis not present

## 2020-10-14 DIAGNOSIS — Z131 Encounter for screening for diabetes mellitus: Secondary | ICD-10-CM | POA: Diagnosis not present

## 2020-10-14 DIAGNOSIS — Z0000 Encounter for general adult medical examination without abnormal findings: Secondary | ICD-10-CM | POA: Diagnosis not present

## 2020-10-14 DIAGNOSIS — Z8572 Personal history of non-Hodgkin lymphomas: Secondary | ICD-10-CM | POA: Diagnosis not present

## 2020-10-14 DIAGNOSIS — L989 Disorder of the skin and subcutaneous tissue, unspecified: Secondary | ICD-10-CM | POA: Diagnosis not present

## 2020-10-15 DIAGNOSIS — M65342 Trigger finger, left ring finger: Secondary | ICD-10-CM | POA: Diagnosis not present

## 2020-10-15 DIAGNOSIS — G5602 Carpal tunnel syndrome, left upper limb: Secondary | ICD-10-CM | POA: Diagnosis not present

## 2020-11-19 DIAGNOSIS — G5602 Carpal tunnel syndrome, left upper limb: Secondary | ICD-10-CM | POA: Diagnosis not present

## 2020-12-02 DIAGNOSIS — Z4789 Encounter for other orthopedic aftercare: Secondary | ICD-10-CM | POA: Diagnosis not present

## 2020-12-02 DIAGNOSIS — G5602 Carpal tunnel syndrome, left upper limb: Secondary | ICD-10-CM | POA: Diagnosis not present

## 2021-02-02 ENCOUNTER — Other Ambulatory Visit: Payer: Self-pay | Admitting: Hematology and Oncology

## 2021-02-02 DIAGNOSIS — Z8579 Personal history of other malignant neoplasms of lymphoid, hematopoietic and related tissues: Secondary | ICD-10-CM

## 2021-02-04 ENCOUNTER — Inpatient Hospital Stay: Payer: Medicare PPO

## 2021-02-04 ENCOUNTER — Encounter: Payer: Self-pay | Admitting: Hematology and Oncology

## 2021-02-04 ENCOUNTER — Inpatient Hospital Stay: Payer: Medicare PPO | Attending: Hematology and Oncology | Admitting: Hematology and Oncology

## 2021-02-04 ENCOUNTER — Other Ambulatory Visit: Payer: Self-pay

## 2021-02-04 DIAGNOSIS — C8590 Non-Hodgkin lymphoma, unspecified, unspecified site: Secondary | ICD-10-CM | POA: Insufficient documentation

## 2021-02-04 DIAGNOSIS — Z8572 Personal history of non-Hodgkin lymphomas: Secondary | ICD-10-CM

## 2021-02-04 DIAGNOSIS — K5909 Other constipation: Secondary | ICD-10-CM

## 2021-02-04 DIAGNOSIS — Z8579 Personal history of other malignant neoplasms of lymphoid, hematopoietic and related tissues: Secondary | ICD-10-CM

## 2021-02-04 LAB — CBC WITH DIFFERENTIAL/PLATELET
Abs Immature Granulocytes: 0.01 10*3/uL (ref 0.00–0.07)
Basophils Absolute: 0 10*3/uL (ref 0.0–0.1)
Basophils Relative: 1 %
Eosinophils Absolute: 0.1 10*3/uL (ref 0.0–0.5)
Eosinophils Relative: 1 %
HCT: 41.7 % (ref 39.0–52.0)
Hemoglobin: 14 g/dL (ref 13.0–17.0)
Immature Granulocytes: 0 %
Lymphocytes Relative: 21 %
Lymphs Abs: 1.1 10*3/uL (ref 0.7–4.0)
MCH: 30.5 pg (ref 26.0–34.0)
MCHC: 33.6 g/dL (ref 30.0–36.0)
MCV: 90.8 fL (ref 80.0–100.0)
Monocytes Absolute: 0.7 10*3/uL (ref 0.1–1.0)
Monocytes Relative: 13 %
Neutro Abs: 3.5 10*3/uL (ref 1.7–7.7)
Neutrophils Relative %: 64 %
Platelets: 267 10*3/uL (ref 150–400)
RBC: 4.59 MIL/uL (ref 4.22–5.81)
RDW: 12.8 % (ref 11.5–15.5)
WBC: 5.4 10*3/uL (ref 4.0–10.5)
nRBC: 0 % (ref 0.0–0.2)

## 2021-02-04 LAB — COMPREHENSIVE METABOLIC PANEL
ALT: 25 U/L (ref 0–44)
AST: 21 U/L (ref 15–41)
Albumin: 3.9 g/dL (ref 3.5–5.0)
Alkaline Phosphatase: 89 U/L (ref 38–126)
Anion gap: 8 (ref 5–15)
BUN: 11 mg/dL (ref 8–23)
CO2: 28 mmol/L (ref 22–32)
Calcium: 8.4 mg/dL — ABNORMAL LOW (ref 8.9–10.3)
Chloride: 103 mmol/L (ref 98–111)
Creatinine, Ser: 0.77 mg/dL (ref 0.61–1.24)
GFR, Estimated: >60 mL/min (ref >60)
Glucose, Bld: 86 mg/dL (ref 70–99)
Potassium: 4.2 mmol/L (ref 3.5–5.1)
Sodium: 139 mmol/L (ref 135–145)
Total Bilirubin: 0.4 mg/dL (ref 0.3–1.2)
Total Protein: 6.7 g/dL (ref 6.5–8.1)

## 2021-02-04 NOTE — Assessment & Plan Note (Signed)
His last CT imaging show no evidence of disease He has no signs of cancer recurrence The patient was diagnosed with only stage I lymphoma that was treated and he has been in remission for almost 8 years We discussed the risk and benefits of future long-term follow-up I educated signs and symptoms to watch out for disease recurrence I would discontinue long-term follow-up

## 2021-02-04 NOTE — Assessment & Plan Note (Signed)
He is taking vitamin D consistently I recommend calcium carbonate daily

## 2021-02-04 NOTE — Progress Notes (Signed)
[facility] OFFICE PROGRESS NOTE  Patient Care Team: Lawerance Cruel, MD as PCP - General (Family Medicine) Ruby Cola, MD as Attending Physician (Otolaryngology) Marylynn Pearson, MD as Attending Physician (Ophthalmology) Leandrew Koyanagi., MD (Urology)  ASSESSMENT & PLAN:  History of lymphoma His last CT imaging show no evidence of disease He has no signs of cancer recurrence The patient was diagnosed with only stage I lymphoma that was treated and he has been in remission for almost 8 years We discussed the risk and benefits of future long-term follow-up I educated signs and symptoms to watch out for disease recurrence I would discontinue long-term follow-up  Chronic constipation This has improved with adequate oral fluid intake He will continue the same along with fiber  Hypocalcemia He is taking vitamin D consistently I recommend calcium carbonate daily   No orders of the defined types were placed in this encounter.   All questions were answered. The patient knows to call the clinic with any problems, questions or concerns. The total time spent in the appointment was 20 minutes encounter with patients including review of chart and various tests results, discussions about plan of care and coordination of care plan   Heath Lark, MD 02/04/2021 12:58 PM  INTERVAL HISTORY: Please see below for problem oriented charting. He returns for further follow-up He is doing well No recent infection Appetite is fair He had carpal tunnel surgery done recently without problems He is able to regulate his bowel movement better with increase oral fluid intake No new lymphadenopathy or skin lesions  SUMMARY OF ONCOLOGIC HISTORY: Oncology History Overview Note  Non-Hodgkin lymphoma   Primary site: Lymphoid Neoplasms   Clinical: Stage I signed by Heath Lark, MD on 01/08/2014 10:33 AM   Pathologic: Stage I signed by Heath Lark, MD on 01/08/2014 10:33 AM   Summary: Stage  I     History of lymphoma  12/20/2012 Imaging   MRI showed 8 mm right subcutaneous lesion is most compatible with a small benign appearing lymph node.    12/26/2012 Surgery   He underwent excisional lymph node biopsy for palpable mass   12/26/2012 Pathology Results   Soft tissue mass, simple excision, right neck - LYMPH NODAL TISSUE WITH ATYPICAL FOLLICULAR PROLIFERATION SUSPICIOUS FOR NON-HODGKIN'S B-CELL LYMPHOMA, SEE COMMENT. Microscopic Comment Much of the specimen consists of soft tissue including fibroadipose tissue and skeletal muscle. The soft tissue contains limited lymph nodal tissue, the largest fragment of which measures 0.8 cm as estimated from the glass slide. This particular fragment displays a few atypical lymphoid follicles characterized by relatively ill defined mantle zones and a homogeneous composition of medium to large lymphoid cells with vesicular chromatin and prominent nucleoli with lack of obvious polarity or abundant tingible body macrophages. Other fragments of lymphoid tissue in the background contain hyperplastic appearing lymphoid follicles with polarity, well defined mantle zones and tingible body macrophages. Flow cytometric analysis was attempted but was non-contributory likely due to sampling. Immunohistochemical stains for CD3, CD20 and BCL-2 were performed with appropriate controls. The atypical follicles in question stain positively for CD20 (B-cell marker) as well as BCL-2. B-cells in the follicles are surrounded by abundant T-cells as seen with CD3. 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 Additional tissue may be warranted for confirmation and further evaluation.    01/08/2013 Imaging   PET/CT scan was negative   07/08/2013 Imaging   CT scan was negative  01/07/2014 Imaging   CT scan was negative   01/26/2017 PET scan   No adenopathy or significant abnormal  hypermetabolic activity to suggest current active lymphoma. Other imaging findings of potential clinical significance: Cholelithiasis. Descending and sigmoid colon diverticulosis.      REVIEW OF SYSTEMS:   Constitutional: Denies fevers, chills or abnormal weight loss Eyes: Denies blurriness of vision Ears, nose, mouth, throat, and face: Denies mucositis or sore throat Respiratory: Denies cough, dyspnea or wheezes Cardiovascular: Denies palpitation, chest discomfort or lower extremity swelling Gastrointestinal:  Denies nausea, heartburn or change in bowel habits Skin: Denies abnormal skin rashes Lymphatics: Denies new lymphadenopathy or easy bruising Neurological:Denies numbness, tingling or new weaknesses Behavioral/Psych: Mood is stable, no new changes  All other systems were reviewed with the patient and are negative.  I have reviewed the past medical history, past surgical history, social history and family history with the patient and they are unchanged from previous note.  ALLERGIES:  is allergic to iodinated diagnostic agents.  MEDICATIONS:  Current Outpatient Medications  Medication Sig Dispense Refill  . aspirin 81 MG tablet Take 81 mg by mouth 2 (two) times a week.    . calcium carbonate (TUMS - DOSED IN MG ELEMENTAL CALCIUM) 500 MG chewable tablet Chew 1 tablet by mouth daily.    . cholecalciferol (VITAMIN D) 1000 units tablet Take 2,000 Units by mouth daily.    . Multiple Vitamin (MULTIVITAMIN) tablet Take 1 tablet by mouth daily.    [name] VYZULTA 0.024 % SOLN Apply 1 drop topically daily.     No current facility-administered medications for this visit.    PHYSICAL EXAMINATION: ECOG PERFORMANCE STATUS: 0 - Asymptomatic  Vitals:   02/04/21 1150  BP: 118/71  Pulse: 68  Resp: 18  Temp: 98 F (36.7 C)  SpO2: 100%   Filed Weights   02/04/21 1150  Weight: 170 lb 9.6 oz (77.4 kg)    GENERAL:alert, no distress and comfortable SKIN: skin color, texture, turgor are  normal, no rashes or significant lesions EYES: normal, Conjunctiva are pink and non-injected, sclera clear OROPHARYNX:no exudate, no erythema and lips, buccal mucosa, and tongue normal  NECK: supple, thyroid normal size, non-tender, without nodularity LYMPH:  no palpable lymphadenopathy in the cervical, axillary or inguinal LUNGS: clear to auscultation and percussion with normal breathing effort HEART: regular rate & rhythm and no murmurs and no lower extremity edema ABDOMEN:abdomen soft, non-tender and normal bowel sounds Musculoskeletal:no cyanosis of digits and no clubbing  NEURO: alert & oriented x 3 with fluent speech, no focal motor/sensory deficits  LABORATORY DATA:  I have reviewed the data as listed    Component Value Date/Time   NA 139 02/04/2021 1134   NA 139 01/13/2017 0927   K 4.2 02/04/2021 1134   K 4.3 01/13/2017 0927   CL 103 02/04/2021 1134   CL 103 01/04/2013 1113   CO2 28 02/04/2021 1134   CO2 28 01/13/2017 0927   GLUCOSE 86 02/04/2021 1134   GLUCOSE 71 01/13/2017 0927   GLUCOSE 106 (H) 01/04/2013 1113   BUN 11 02/04/2021 1134   BUN 16.0 01/13/2017 0927   CREATININE 0.77 02/04/2021 1134   CREATININE 0.8 01/13/2017 0927   CALCIUM 8.4 (L) 02/04/2021 1134   CALCIUM 8.9 01/13/2017 0927   PROT 6.7 02/04/2021 1134   PROT 6.7 01/13/2017 0927   ALBUMIN 3.9 02/04/2021 1134   ALBUMIN 4.0 01/13/2017 0927   AST 21 02/04/2021 1134   AST 24 01/13/2017 0927   ALT 25  02/04/2021 1134   ALT 34 01/13/2017 0927   ALKPHOS 89 02/04/2021 1134   ALKPHOS 97 01/13/2017 0927   BILITOT 0.4 02/04/2021 1134   BILITOT 0.57 01/13/2017 0927   GFRNONAA >60 02/04/2021 1134   GFRAA >60 01/28/2020 0831    No results found for: SPEP, UPEP  Lab Results  Component Value Date   WBC 5.4 02/04/2021   NEUTROABS 3.5 02/04/2021   HGB 14.0 02/04/2021   HCT 41.7 02/04/2021   MCV 90.8 02/04/2021   PLT 267 02/04/2021      Chemistry      Component Value Date/Time   NA 139 02/04/2021  1134   NA 139 01/13/2017 0927   K 4.2 02/04/2021 1134   K 4.3 01/13/2017 0927   CL 103 02/04/2021 1134   CL 103 01/04/2013 1113   CO2 28 02/04/2021 1134   CO2 28 01/13/2017 0927   BUN 11 02/04/2021 1134   BUN 16.0 01/13/2017 0927   CREATININE 0.77 02/04/2021 1134   CREATININE 0.8 01/13/2017 0927      Component Value Date/Time   CALCIUM 8.4 (L) 02/04/2021 1134   CALCIUM 8.9 01/13/2017 0927   ALKPHOS 89 02/04/2021 1134   ALKPHOS 97 01/13/2017 0927   AST 21 02/04/2021 1134   AST 24 01/13/2017 0927   ALT 25 02/04/2021 1134   ALT 34 01/13/2017 0927   BILITOT 0.4 02/04/2021 1134   BILITOT 0.57 01/13/2017 3254

## 2021-02-04 NOTE — Assessment & Plan Note (Signed)
This has improved with adequate oral fluid intake He will continue the same along with fiber

## 2021-02-23 DIAGNOSIS — H401231 Low-tension glaucoma, bilateral, mild stage: Secondary | ICD-10-CM | POA: Diagnosis not present

## 2021-02-23 DIAGNOSIS — H25813 Combined forms of age-related cataract, bilateral: Secondary | ICD-10-CM | POA: Diagnosis not present

## 2021-04-18 DIAGNOSIS — H6982 Other specified disorders of Eustachian tube, left ear: Secondary | ICD-10-CM | POA: Diagnosis not present

## 2021-04-18 DIAGNOSIS — H6122 Impacted cerumen, left ear: Secondary | ICD-10-CM | POA: Diagnosis not present

## 2021-04-19 DIAGNOSIS — U071 COVID-19: Secondary | ICD-10-CM | POA: Diagnosis not present

## 2021-04-24 DIAGNOSIS — M778 Other enthesopathies, not elsewhere classified: Secondary | ICD-10-CM | POA: Diagnosis not present

## 2021-04-27 DIAGNOSIS — H6122 Impacted cerumen, left ear: Secondary | ICD-10-CM | POA: Diagnosis not present

## 2021-05-11 DIAGNOSIS — M25531 Pain in right wrist: Secondary | ICD-10-CM | POA: Diagnosis not present

## 2021-05-11 DIAGNOSIS — M654 Radial styloid tenosynovitis [de Quervain]: Secondary | ICD-10-CM | POA: Diagnosis not present

## 2021-05-27 DIAGNOSIS — M654 Radial styloid tenosynovitis [de Quervain]: Secondary | ICD-10-CM | POA: Diagnosis not present

## 2021-05-27 DIAGNOSIS — M25531 Pain in right wrist: Secondary | ICD-10-CM | POA: Diagnosis not present

## 2021-08-04 DIAGNOSIS — R972 Elevated prostate specific antigen [PSA]: Secondary | ICD-10-CM | POA: Diagnosis not present

## 2021-08-27 DIAGNOSIS — M654 Radial styloid tenosynovitis [de Quervain]: Secondary | ICD-10-CM | POA: Diagnosis not present

## 2021-08-31 DIAGNOSIS — H401231 Low-tension glaucoma, bilateral, mild stage: Secondary | ICD-10-CM | POA: Diagnosis not present

## 2021-08-31 DIAGNOSIS — H25813 Combined forms of age-related cataract, bilateral: Secondary | ICD-10-CM | POA: Diagnosis not present

## 2021-09-27 DIAGNOSIS — L570 Actinic keratosis: Secondary | ICD-10-CM | POA: Diagnosis not present

## 2021-09-27 DIAGNOSIS — Z85828 Personal history of other malignant neoplasm of skin: Secondary | ICD-10-CM | POA: Diagnosis not present

## 2021-09-27 DIAGNOSIS — L821 Other seborrheic keratosis: Secondary | ICD-10-CM | POA: Diagnosis not present

## 2021-09-27 DIAGNOSIS — H2513 Age-related nuclear cataract, bilateral: Secondary | ICD-10-CM | POA: Diagnosis not present

## 2021-09-27 DIAGNOSIS — H5213 Myopia, bilateral: Secondary | ICD-10-CM | POA: Diagnosis not present

## 2021-09-27 DIAGNOSIS — C44519 Basal cell carcinoma of skin of other part of trunk: Secondary | ICD-10-CM | POA: Diagnosis not present

## 2021-09-27 DIAGNOSIS — D3617 Benign neoplasm of peripheral nerves and autonomic nervous system of trunk, unspecified: Secondary | ICD-10-CM | POA: Diagnosis not present

## 2021-09-27 DIAGNOSIS — L814 Other melanin hyperpigmentation: Secondary | ICD-10-CM | POA: Diagnosis not present

## 2021-10-25 DIAGNOSIS — Z0000 Encounter for general adult medical examination without abnormal findings: Secondary | ICD-10-CM | POA: Diagnosis not present

## 2021-10-25 DIAGNOSIS — Z1389 Encounter for screening for other disorder: Secondary | ICD-10-CM | POA: Diagnosis not present

## 2021-10-25 DIAGNOSIS — Z8572 Personal history of non-Hodgkin lymphomas: Secondary | ICD-10-CM | POA: Diagnosis not present

## 2021-10-25 DIAGNOSIS — Z136 Encounter for screening for cardiovascular disorders: Secondary | ICD-10-CM | POA: Diagnosis not present

## 2021-10-25 DIAGNOSIS — M654 Radial styloid tenosynovitis [de Quervain]: Secondary | ICD-10-CM | POA: Diagnosis not present

## 2021-10-28 DIAGNOSIS — M5459 Other low back pain: Secondary | ICD-10-CM | POA: Diagnosis not present

## 2021-10-28 DIAGNOSIS — M545 Low back pain, unspecified: Secondary | ICD-10-CM | POA: Diagnosis not present

## 2021-11-03 DIAGNOSIS — Z8572 Personal history of non-Hodgkin lymphomas: Secondary | ICD-10-CM | POA: Diagnosis not present

## 2021-11-03 DIAGNOSIS — N400 Enlarged prostate without lower urinary tract symptoms: Secondary | ICD-10-CM | POA: Diagnosis not present

## 2021-11-03 DIAGNOSIS — M654 Radial styloid tenosynovitis [de Quervain]: Secondary | ICD-10-CM | POA: Diagnosis not present

## 2021-11-03 DIAGNOSIS — Z87898 Personal history of other specified conditions: Secondary | ICD-10-CM | POA: Diagnosis not present

## 2021-11-04 DIAGNOSIS — M545 Low back pain, unspecified: Secondary | ICD-10-CM | POA: Diagnosis not present

## 2021-11-04 DIAGNOSIS — M5459 Other low back pain: Secondary | ICD-10-CM | POA: Diagnosis not present

## 2021-11-18 DIAGNOSIS — G43909 Migraine, unspecified, not intractable, without status migrainosus: Secondary | ICD-10-CM | POA: Diagnosis not present

## 2021-11-18 DIAGNOSIS — M5459 Other low back pain: Secondary | ICD-10-CM | POA: Diagnosis not present

## 2021-11-18 DIAGNOSIS — M545 Low back pain, unspecified: Secondary | ICD-10-CM | POA: Diagnosis not present

## 2021-11-23 DIAGNOSIS — M545 Low back pain, unspecified: Secondary | ICD-10-CM | POA: Diagnosis not present

## 2021-11-23 DIAGNOSIS — G43909 Migraine, unspecified, not intractable, without status migrainosus: Secondary | ICD-10-CM | POA: Diagnosis not present

## 2021-11-23 DIAGNOSIS — M5459 Other low back pain: Secondary | ICD-10-CM | POA: Diagnosis not present

## 2021-11-26 DIAGNOSIS — M545 Low back pain, unspecified: Secondary | ICD-10-CM | POA: Diagnosis not present

## 2021-11-26 DIAGNOSIS — M5459 Other low back pain: Secondary | ICD-10-CM | POA: Diagnosis not present

## 2021-11-26 DIAGNOSIS — G43909 Migraine, unspecified, not intractable, without status migrainosus: Secondary | ICD-10-CM | POA: Diagnosis not present

## 2021-11-29 DIAGNOSIS — G43909 Migraine, unspecified, not intractable, without status migrainosus: Secondary | ICD-10-CM | POA: Diagnosis not present

## 2021-11-29 DIAGNOSIS — M5459 Other low back pain: Secondary | ICD-10-CM | POA: Diagnosis not present

## 2021-11-29 DIAGNOSIS — M545 Low back pain, unspecified: Secondary | ICD-10-CM | POA: Diagnosis not present

## 2021-12-02 DIAGNOSIS — G43909 Migraine, unspecified, not intractable, without status migrainosus: Secondary | ICD-10-CM | POA: Diagnosis not present

## 2021-12-02 DIAGNOSIS — M545 Low back pain, unspecified: Secondary | ICD-10-CM | POA: Diagnosis not present

## 2021-12-02 DIAGNOSIS — M5459 Other low back pain: Secondary | ICD-10-CM | POA: Diagnosis not present

## 2022-01-11 DIAGNOSIS — J029 Acute pharyngitis, unspecified: Secondary | ICD-10-CM | POA: Diagnosis not present

## 2022-02-23 DIAGNOSIS — K5730 Diverticulosis of large intestine without perforation or abscess without bleeding: Secondary | ICD-10-CM | POA: Diagnosis not present

## 2022-02-23 DIAGNOSIS — D123 Benign neoplasm of transverse colon: Secondary | ICD-10-CM | POA: Diagnosis not present

## 2022-02-23 DIAGNOSIS — Z86010 Personal history of colonic polyps: Secondary | ICD-10-CM | POA: Diagnosis not present

## 2022-02-23 DIAGNOSIS — K649 Unspecified hemorrhoids: Secondary | ICD-10-CM | POA: Diagnosis not present

## 2022-03-01 DIAGNOSIS — D123 Benign neoplasm of transverse colon: Secondary | ICD-10-CM | POA: Diagnosis not present

## 2022-03-15 DIAGNOSIS — H25813 Combined forms of age-related cataract, bilateral: Secondary | ICD-10-CM | POA: Diagnosis not present

## 2022-03-15 DIAGNOSIS — H401231 Low-tension glaucoma, bilateral, mild stage: Secondary | ICD-10-CM | POA: Diagnosis not present

## 2022-04-05 DIAGNOSIS — M25511 Pain in right shoulder: Secondary | ICD-10-CM | POA: Diagnosis not present

## 2022-04-05 DIAGNOSIS — M25512 Pain in left shoulder: Secondary | ICD-10-CM | POA: Diagnosis not present

## 2022-04-05 DIAGNOSIS — M7541 Impingement syndrome of right shoulder: Secondary | ICD-10-CM | POA: Diagnosis not present

## 2023-03-21 DIAGNOSIS — H401231 Low-tension glaucoma, bilateral, mild stage: Secondary | ICD-10-CM | POA: Diagnosis not present

## 2023-03-21 DIAGNOSIS — H25813 Combined forms of age-related cataract, bilateral: Secondary | ICD-10-CM | POA: Diagnosis not present

## 2023-07-09 DIAGNOSIS — U071 COVID-19: Secondary | ICD-10-CM | POA: Diagnosis not present

## 2023-09-11 DIAGNOSIS — R634 Abnormal weight loss: Secondary | ICD-10-CM | POA: Diagnosis not present

## 2023-09-11 DIAGNOSIS — K5730 Diverticulosis of large intestine without perforation or abscess without bleeding: Secondary | ICD-10-CM | POA: Diagnosis not present

## 2023-09-11 DIAGNOSIS — R6881 Early satiety: Secondary | ICD-10-CM | POA: Diagnosis not present

## 2023-09-12 DIAGNOSIS — M654 Radial styloid tenosynovitis [de Quervain]: Secondary | ICD-10-CM | POA: Diagnosis not present

## 2023-09-26 DIAGNOSIS — H25813 Combined forms of age-related cataract, bilateral: Secondary | ICD-10-CM | POA: Diagnosis not present

## 2023-09-26 DIAGNOSIS — H401231 Low-tension glaucoma, bilateral, mild stage: Secondary | ICD-10-CM | POA: Diagnosis not present

## 2023-10-05 DIAGNOSIS — L821 Other seborrheic keratosis: Secondary | ICD-10-CM | POA: Diagnosis not present

## 2023-10-05 DIAGNOSIS — Z85828 Personal history of other malignant neoplasm of skin: Secondary | ICD-10-CM | POA: Diagnosis not present

## 2023-10-05 DIAGNOSIS — I8391 Asymptomatic varicose veins of right lower extremity: Secondary | ICD-10-CM | POA: Diagnosis not present

## 2023-10-05 DIAGNOSIS — L603 Nail dystrophy: Secondary | ICD-10-CM | POA: Diagnosis not present

## 2023-10-05 DIAGNOSIS — L814 Other melanin hyperpigmentation: Secondary | ICD-10-CM | POA: Diagnosis not present

## 2023-10-19 DIAGNOSIS — M654 Radial styloid tenosynovitis [de Quervain]: Secondary | ICD-10-CM | POA: Diagnosis not present

## 2023-10-30 DIAGNOSIS — R109 Unspecified abdominal pain: Secondary | ICD-10-CM | POA: Diagnosis not present

## 2023-10-31 DIAGNOSIS — K529 Noninfective gastroenteritis and colitis, unspecified: Secondary | ICD-10-CM | POA: Diagnosis not present

## 2023-10-31 DIAGNOSIS — Z0000 Encounter for general adult medical examination without abnormal findings: Secondary | ICD-10-CM | POA: Diagnosis not present

## 2023-10-31 DIAGNOSIS — R109 Unspecified abdominal pain: Secondary | ICD-10-CM | POA: Diagnosis not present

## 2023-10-31 DIAGNOSIS — Z6821 Body mass index (BMI) 21.0-21.9, adult: Secondary | ICD-10-CM | POA: Diagnosis not present

## 2023-10-31 DIAGNOSIS — Z1331 Encounter for screening for depression: Secondary | ICD-10-CM | POA: Diagnosis not present

## 2023-11-21 DIAGNOSIS — Z0000 Encounter for general adult medical examination without abnormal findings: Secondary | ICD-10-CM | POA: Diagnosis not present

## 2023-11-21 DIAGNOSIS — Z136 Encounter for screening for cardiovascular disorders: Secondary | ICD-10-CM | POA: Diagnosis not present

## 2023-11-21 DIAGNOSIS — Z8572 Personal history of non-Hodgkin lymphomas: Secondary | ICD-10-CM | POA: Diagnosis not present

## 2023-11-21 DIAGNOSIS — Z08 Encounter for follow-up examination after completed treatment for malignant neoplasm: Secondary | ICD-10-CM | POA: Diagnosis not present

## 2023-11-21 DIAGNOSIS — M654 Radial styloid tenosynovitis [de Quervain]: Secondary | ICD-10-CM | POA: Diagnosis not present

## 2023-11-21 DIAGNOSIS — R194 Change in bowel habit: Secondary | ICD-10-CM | POA: Diagnosis not present

## 2023-12-08 DIAGNOSIS — M654 Radial styloid tenosynovitis [de Quervain]: Secondary | ICD-10-CM | POA: Diagnosis not present

## 2023-12-15 DIAGNOSIS — M7021 Olecranon bursitis, right elbow: Secondary | ICD-10-CM | POA: Diagnosis not present

## 2023-12-25 DIAGNOSIS — M7021 Olecranon bursitis, right elbow: Secondary | ICD-10-CM | POA: Diagnosis not present

## 2023-12-28 DIAGNOSIS — M8588 Other specified disorders of bone density and structure, other site: Secondary | ICD-10-CM | POA: Diagnosis not present

## 2024-03-19 DIAGNOSIS — H401231 Low-tension glaucoma, bilateral, mild stage: Secondary | ICD-10-CM | POA: Diagnosis not present

## 2024-03-19 DIAGNOSIS — H25813 Combined forms of age-related cataract, bilateral: Secondary | ICD-10-CM | POA: Diagnosis not present

## 2024-04-04 DIAGNOSIS — L820 Inflamed seborrheic keratosis: Secondary | ICD-10-CM | POA: Diagnosis not present

## 2024-04-04 DIAGNOSIS — B351 Tinea unguium: Secondary | ICD-10-CM | POA: Diagnosis not present

## 2024-04-04 DIAGNOSIS — M79602 Pain in left arm: Secondary | ICD-10-CM | POA: Diagnosis not present

## 2024-04-04 DIAGNOSIS — L570 Actinic keratosis: Secondary | ICD-10-CM | POA: Diagnosis not present

## 2024-04-04 DIAGNOSIS — Z85828 Personal history of other malignant neoplasm of skin: Secondary | ICD-10-CM | POA: Diagnosis not present

## 2024-05-29 DIAGNOSIS — Z6820 Body mass index (BMI) 20.0-20.9, adult: Secondary | ICD-10-CM | POA: Diagnosis not present

## 2024-05-29 DIAGNOSIS — M79641 Pain in right hand: Secondary | ICD-10-CM | POA: Diagnosis not present

## 2024-05-29 DIAGNOSIS — M2540 Effusion, unspecified joint: Secondary | ICD-10-CM | POA: Diagnosis not present

## 2024-05-29 DIAGNOSIS — R5383 Other fatigue: Secondary | ICD-10-CM | POA: Diagnosis not present

## 2024-05-29 DIAGNOSIS — M79642 Pain in left hand: Secondary | ICD-10-CM | POA: Diagnosis not present

## 2024-06-13 ENCOUNTER — Other Ambulatory Visit: Payer: Self-pay | Admitting: Gastroenterology

## 2024-06-13 DIAGNOSIS — Z8572 Personal history of non-Hodgkin lymphomas: Secondary | ICD-10-CM | POA: Diagnosis not present

## 2024-06-13 DIAGNOSIS — R194 Change in bowel habit: Secondary | ICD-10-CM | POA: Diagnosis not present

## 2024-06-13 DIAGNOSIS — R634 Abnormal weight loss: Secondary | ICD-10-CM | POA: Diagnosis not present

## 2024-06-13 DIAGNOSIS — R109 Unspecified abdominal pain: Secondary | ICD-10-CM

## 2024-06-14 ENCOUNTER — Encounter: Payer: Self-pay | Admitting: Gastroenterology

## 2024-06-17 ENCOUNTER — Telehealth: Payer: Self-pay

## 2024-06-17 MED ORDER — PREDNISONE 50 MG PO TABS: ORAL_TABLET | ORAL | 0 refills | Status: AC

## 2024-06-17 NOTE — Telephone Encounter (Signed)
 LMOM on both home phone and cell phone for patient to call me to discuss his contrast allergy and the need for a 13-hour prep which was sent to his pharmacy in [facility].Pt to take 50 mg of prednisone  on 06/19/24 at 1:00am, 50 mg of prednisone  on 06/19/24 at 7:00am, and 50 mg of prednisone  on 06/19/24 at 1:00pm. Pt is also to take 50 mg of benadryl  on 06/19/24 at 1:00pm. Please call [phone] with any questions.

## 2024-06-19 ENCOUNTER — Inpatient Hospital Stay
Admission: RE | Admit: 2024-06-19 | Discharge: 2024-06-19 | Disposition: A | Discharge status: Home / Self Care | Source: Ambulatory Visit | Attending: Gastroenterology | Admitting: Gastroenterology

## 2024-06-19 DIAGNOSIS — R194 Change in bowel habit: Secondary | ICD-10-CM

## 2024-06-19 DIAGNOSIS — R634 Abnormal weight loss: Secondary | ICD-10-CM

## 2024-06-19 DIAGNOSIS — R109 Unspecified abdominal pain: Secondary | ICD-10-CM

## 2024-06-19 DIAGNOSIS — K8020 Calculus of gallbladder without cholecystitis without obstruction: Secondary | ICD-10-CM | POA: Diagnosis not present

## 2024-06-19 DIAGNOSIS — K5730 Diverticulosis of large intestine without perforation or abscess without bleeding: Secondary | ICD-10-CM | POA: Diagnosis not present

## 2024-06-19 MED ORDER — IOPAMIDOL (ISOVUE-300) INJECTION 61%
100.0000 mL | Freq: Once | INTRAVENOUS | Status: AC | PRN
  Administered 2024-06-19: 100 mL via INTRAVENOUS

## 2024-07-04 DIAGNOSIS — M2540 Effusion, unspecified joint: Secondary | ICD-10-CM | POA: Diagnosis not present

## 2024-07-04 DIAGNOSIS — M79642 Pain in left hand: Secondary | ICD-10-CM | POA: Diagnosis not present

## 2024-07-04 DIAGNOSIS — M79641 Pain in right hand: Secondary | ICD-10-CM | POA: Diagnosis not present

## 2024-07-04 DIAGNOSIS — Z6820 Body mass index (BMI) 20.0-20.9, adult: Secondary | ICD-10-CM | POA: Diagnosis not present

## 2024-07-04 DIAGNOSIS — M0609 Rheumatoid arthritis without rheumatoid factor, multiple sites: Secondary | ICD-10-CM | POA: Diagnosis not present

## 2024-09-18 DIAGNOSIS — R3912 Poor urinary stream: Secondary | ICD-10-CM | POA: Diagnosis not present

## 2024-09-18 DIAGNOSIS — N401 Enlarged prostate with lower urinary tract symptoms: Secondary | ICD-10-CM | POA: Diagnosis not present

## 2024-09-18 DIAGNOSIS — R351 Nocturia: Secondary | ICD-10-CM | POA: Diagnosis not present

## 2024-09-24 DIAGNOSIS — H401231 Low-tension glaucoma, bilateral, mild stage: Secondary | ICD-10-CM | POA: Diagnosis not present

## 2024-10-03 DIAGNOSIS — M2540 Effusion, unspecified joint: Secondary | ICD-10-CM | POA: Diagnosis not present

## 2024-10-03 DIAGNOSIS — M0609 Rheumatoid arthritis without rheumatoid factor, multiple sites: Secondary | ICD-10-CM | POA: Diagnosis not present

## 2024-10-03 DIAGNOSIS — Z6820 Body mass index (BMI) 20.0-20.9, adult: Secondary | ICD-10-CM | POA: Diagnosis not present

## 2024-10-03 DIAGNOSIS — M79642 Pain in left hand: Secondary | ICD-10-CM | POA: Diagnosis not present

## 2024-10-03 DIAGNOSIS — M79641 Pain in right hand: Secondary | ICD-10-CM | POA: Diagnosis not present

## 2024-10-07 DIAGNOSIS — D3617 Benign neoplasm of peripheral nerves and autonomic nervous system of trunk, unspecified: Secondary | ICD-10-CM | POA: Diagnosis not present

## 2024-10-07 DIAGNOSIS — L570 Actinic keratosis: Secondary | ICD-10-CM | POA: Diagnosis not present

## 2024-10-07 DIAGNOSIS — D1801 Hemangioma of skin and subcutaneous tissue: Secondary | ICD-10-CM | POA: Diagnosis not present

## 2024-10-07 DIAGNOSIS — L814 Other melanin hyperpigmentation: Secondary | ICD-10-CM | POA: Diagnosis not present

## 2024-10-07 DIAGNOSIS — L821 Other seborrheic keratosis: Secondary | ICD-10-CM | POA: Diagnosis not present

## 2024-10-07 DIAGNOSIS — Z85828 Personal history of other malignant neoplasm of skin: Secondary | ICD-10-CM | POA: Diagnosis not present

## 2024-10-30 ENCOUNTER — Telehealth: Payer: Self-pay | Admitting: Gastroenterology

## 2024-10-30 NOTE — Telephone Encounter (Signed)
 Good afternoon Dr. Leigh  The following patient has a referral to us  for Noninfective gastroenteritis and colitis and weight loss. He is a patient of [facility] and does not want to transfer care only to have a whipple disease test. He was told that [facility] office was unable to perform the test. Records are available in Epic. Please review and advise of scheduling.

## 2024-10-30 NOTE — Telephone Encounter (Signed)
 Can you clarify a few things: - I can't see records from Dr. Janett last note / visit, they do not seem to be scanned into Epic. Can you clarify how I can see them - can you clarify that he DOES or DOES NOT want whipple's disease testing?

## 2024-11-29 NOTE — Telephone Encounter (Signed)
 Whipple's disease is diagnosed with endoscopy and biopsies of the small bowel with special staining, or with specialized stool testing.  If the patient wants that done he should talk with Dr. Rosalie, this would require the pathologist to do special stain, and is not necessarily an extra test for the GI physician to perform.  I would not see him in our office for that specifically, if he wishes to continue care with Dr. Rosalie.  He can talk with Dr. Rosalie about his options and testing that they can accommodate for him.

## 2024-11-29 NOTE — Telephone Encounter (Signed)
 Good morning Dr. Leigh,   I reach out to patient regarding previous note. Patient stated he does not wish to transfer his care. He would like to have whipple disease testing completed with our office. States he has been with Dr. Rosalie for a long time and would like to stay under his care. States Dr. Rosalie does not perform whipple test that patient is wishing to complete. Please review and advise further.   Thank you.

## 2024-12-12 NOTE — Telephone Encounter (Signed)
 Patient has been advised
